# Patient Record
Sex: Female | Born: 1980 | Hispanic: Yes | Marital: Single | Smoking: Never smoker
Health system: Southern US, Community
[De-identification: ages and names within clinical notes are randomized; demographics above are authoritative.]

## PROBLEM LIST (undated history)

## (undated) ENCOUNTER — Inpatient Hospital Stay: Payer: Self-pay

## (undated) DIAGNOSIS — K219 Gastro-esophageal reflux disease without esophagitis: Secondary | ICD-10-CM

---

## 2007-07-02 ENCOUNTER — Ambulatory Visit: Payer: Self-pay | Admitting: Family Medicine

## 2007-07-04 ENCOUNTER — Observation Stay: Payer: Self-pay

## 2007-07-07 ENCOUNTER — Encounter: Payer: Self-pay | Admitting: Maternal & Fetal Medicine

## 2007-07-10 ENCOUNTER — Observation Stay: Payer: Self-pay | Admitting: Obstetrics and Gynecology

## 2007-07-13 ENCOUNTER — Inpatient Hospital Stay: Payer: Self-pay | Admitting: Certified Nurse Midwife

## 2010-12-17 ENCOUNTER — Emergency Department: Payer: Self-pay | Admitting: Emergency Medicine

## 2011-02-20 ENCOUNTER — Emergency Department: Payer: Self-pay | Admitting: Emergency Medicine

## 2011-05-10 ENCOUNTER — Emergency Department: Payer: Self-pay | Admitting: Emergency Medicine

## 2013-04-11 ENCOUNTER — Emergency Department: Payer: Self-pay | Admitting: Emergency Medicine

## 2017-03-03 ENCOUNTER — Emergency Department
Admission: EM | Admit: 2017-03-03 | Discharge: 2017-03-03 | Disposition: A | Discharge status: Home / Self Care | Payer: Self-pay | Attending: Emergency Medicine | Admitting: Emergency Medicine

## 2017-03-03 ENCOUNTER — Emergency Department: Payer: Self-pay

## 2017-03-03 ENCOUNTER — Encounter: Payer: Self-pay | Admitting: Emergency Medicine

## 2017-03-03 ENCOUNTER — Other Ambulatory Visit: Payer: Self-pay

## 2017-03-03 DIAGNOSIS — J069 Acute upper respiratory infection, unspecified: Secondary | ICD-10-CM | POA: Insufficient documentation

## 2017-03-03 DIAGNOSIS — B9789 Other viral agents as the cause of diseases classified elsewhere: Secondary | ICD-10-CM | POA: Insufficient documentation

## 2017-03-03 MED ORDER — FLUTICASONE PROPIONATE 50 MCG/ACT NA SUSP: 1.0000 | Freq: Two times a day (BID) | NASAL | 0 refills | Status: DC

## 2017-03-03 MED ORDER — PSEUDOEPH-BROMPHEN-DM 30-2-10 MG/5ML PO SYRP
10.0000 mL | ORAL_SOLUTION | Freq: Four times a day (QID) | ORAL | 0 refills | Status: DC | PRN
Start: 2017-03-03 — End: 2017-12-05

## 2017-03-03 NOTE — ED Provider Notes (Signed)
[facility] Provider Note  ____________________________________________  Time seen: Approximately 8:00 PM  I have reviewed the triage vital signs and the nursing notes.   HISTORY  Chief Complaint Cough    HPI Meagan Morris is a 36 y.o. female who presents to the emergency department complaining of 4 days worth of symptoms began insidiously.  Patient has been taking Alka-Seltzer and cold and flu medication over-the-counter with no relief.  Patient denies any headache, visual changes, neck pain or stiffness, chest pain, shortness of breath, abdominal pain, nausea vomiting.  Cough is nonproductive.  No other complaints at this time.  No other medications.   History reviewed. No pertinent past medical history.  There are no active problems to display for this patient.   History reviewed. No pertinent surgical history.  Prior to Admission medications   Medication Sig Start Date End Date Taking? Authorizing Provider  brompheniramine-pseudoephedrine-DM 30-2-10 MG/5ML syrup Take 10 mLs by mouth 4 (four) times daily as needed. 03/03/17   Amarion Portell, Delorise RoyalsJonathan D, PA-C  fluticasone (FLONASE) 50 MCG/ACT nasal spray Place 1 spray into both nostrils 2 (two) times daily. 03/03/17   Mahogany Torrance, Delorise RoyalsJonathan D, PA-C    Allergies Patient has no known allergies.  No family history on file.  Social History Social History   Tobacco Use  . Smoking status: Not on file  Substance Use Topics  . Alcohol use: Not on file  . Drug use: Not on file     Review of Systems  Constitutional: Positive fever/chills Eyes: No visual changes. No discharge ENT: Positive for nasal congestion sore throat Cardiovascular: no chest pain. Respiratory: Positive cough. No SOB. Gastrointestinal: No abdominal pain.  No nausea, no vomiting.  No diarrhea.  No constipation. Musculoskeletal: Negative for musculoskeletal pain. Skin: Negative for rash, abrasions,  lacerations, ecchymosis. Neurological: Negative for headaches, focal weakness or numbness. 10-point ROS otherwise negative.  ____________________________________________   PHYSICAL EXAM:  VITAL SIGNS: ED Triage Vitals  Enc Vitals Group     BP 03/03/17 1949 101/66     Pulse Rate 03/03/17 1949 96     Resp 03/03/17 1949 20     Temp 03/03/17 1949 98.3 F (36.8 C)     Temp Source 03/03/17 1949 Oral     SpO2 03/03/17 1949 96 %     Weight 03/03/17 1946 176 lb (79.8 kg)     Height 03/03/17 1946 5\' 2"  (1.575 m)     Head Circumference --      Peak Flow --      Pain Score 03/03/17 1949 9     Pain Loc --      Pain Edu? --      Excl. in GC? --      Constitutional: Alert and oriented. Well appearing and in no acute distress. Eyes: Conjunctivae are normal. PERRL. EOMI. Head: Atraumatic. ENT:      Ears: EACs and TMs unremarkable bilaterally      Nose: Moderate clear congestion/rhinnorhea.      Mouth/Throat: Mucous membranes are moist.  Pharynx mildly erythematous but nonedematous.  Tonsils unremarkable bilaterally.  Uvula is midline.   Neck: No stridor.  Neck is supple full range of motion Hematological/Lymphatic/Immunilogical: No cervical lymphadenopathy. Cardiovascular: Normal rate, regular rhythm. Normal S1 and S2.  Good peripheral circulation. Respiratory: Normal respiratory effort without tachypnea or retractions. Lungs CTAB. Good air entry to the bases with no decreased or absent breath sounds. Musculoskeletal: Full range of motion to all extremities. No gross deformities appreciated.  Neurologic:  Normal speech and language. No gross focal neurologic deficits are appreciated.  Skin:  Skin is warm, dry and intact. No rash noted. Psychiatric: Mood and affect are normal. Speech and behavior are normal. Patient exhibits appropriate insight and judgement.   ____________________________________________   LABS (all labs ordered are listed, but only abnormal results are  displayed)  Labs Reviewed - No data to display ____________________________________________  EKG   ____________________________________________  RADIOLOGY Festus BarrenI, Eytan Carrigan D Ivonna Kinnick, personally viewed and evaluated these images (plain radiographs) as part of my medical decision making, as well as reviewing the written report by the radiologist.  Dg Chest 2 View  Result Date: 03/03/2017 CLINICAL DATA:  36 year old female with cough. EXAM: CHEST  2 VIEW COMPARISON:  None. FINDINGS: The heart size and mediastinal contours are within normal limits. Both lungs are clear. The visualized skeletal structures are unremarkable. IMPRESSION: No active cardiopulmonary disease. Electronically Signed   By: Elgie CollardArash  Radparvar M.D.   On: 03/03/2017 20:17    ____________________________________________    PROCEDURES  Procedure(s) performed:    Procedures    Medications - No data to display   ____________________________________________   INITIAL IMPRESSION / ASSESSMENT AND PLAN / ED COURSE  Pertinent labs & imaging results that were available during my care of the patient were reviewed by me and considered in my medical decision making (see chart for details).  Review of the [place] CSRS was performed in accordance of the [facility] prior to dispensing any controlled drugs.     Patient's diagnosis is consistent with viral URI.  Differential included influenza, viral URI, strep.  Chest x-ray reveals no acute consolidation consistent with pneumonia.  Exam was otherwise reassuring.  No indication for further workup.. Patient will be discharged home with prescriptions for Flonase, Bromfed cough syrup. Patient is to follow up with primary care as needed or otherwise directed. Patient is given ED precautions to return to the ED for any worsening or new symptoms.     ____________________________________________  FINAL CLINICAL IMPRESSION(S) / ED DIAGNOSES  Final diagnoses:  Viral URI with cough       NEW MEDICATIONS STARTED DURING THIS VISIT:  ED Discharge Orders        Ordered    fluticasone (FLONASE) 50 MCG/ACT nasal spray  2 times daily     03/03/17 2033    brompheniramine-pseudoephedrine-DM 30-2-10 MG/5ML syrup  4 times daily PRN     03/03/17 2033          This chart was dictated using voice recognition software/Dragon. Despite best efforts to proofread, errors can occur which can change the meaning. Any change was purely unintentional.    Racheal PatchesCuthriell, Lillyonna Armstead D, PA-C 03/03/17 2034    Sharyn CreamerQuale, Mark, MD 03/04/17 [phone]

## 2017-03-03 NOTE — ED Triage Notes (Signed)
Pt presents w/ cough and fever x 4 days. Pt c/o slight nausea, denies vomiting.

## 2017-03-03 NOTE — ED Notes (Signed)
Reviewed discharge instructions, follow-up care, and prescriptions with patient. Patient verbalized understanding of all information reviewed. Patient stable, with no distress noted at this time.    

## 2017-03-03 NOTE — ED Notes (Signed)
Patient transported to X-ray 

## 2017-03-12 NOTE — L&D Delivery Note (Signed)
Delivery Note  First Stage: Labor onset: 0300 Augmentation : AROM Analgesia /Anesthesia intrapartum: Epidural AROM at 1012  Second Stage: Complete dilation at 1303 Onset of pushing at 1308 FHR second stage Cat I   Delivery of a viable female on 01/11/2018 at 1310 by CNM delivery of fetal head in ROA position with restitution to ROT. No nuchal cord;  Anterior then posterior shoulders delivered easily with gentle downward traction. Baby placed on mom's chest, and attended to by peds.  Cord double clamped after cessation of pulsation, cut by FOB Cord blood sample collected    Third Stage: Placenta delivered spontaneously intact with 3VC @ 1315 Placenta disposition: routine disposal Uterine tone initially boggy and massaged firm with brisk bleeding, Cytotec PR, uterus then remained firm with scant bleeding.   1st deg perineal laceration identified  Anesthesia for repair: epidural Repair: 3-0 Vicryl SH x 1 Est. Blood Loss (mL):  Complications: none  Mom to postpartum.  Baby to Couplet care / Skin to Skin.  Newborn: Birth Weight: pending Apgar Scores: 8/9 Feeding planned: Br/Bo

## 2017-03-14 ENCOUNTER — Other Ambulatory Visit: Payer: Self-pay

## 2017-03-14 ENCOUNTER — Emergency Department
Admission: EM | Admit: 2017-03-14 | Discharge: 2017-03-14 | Disposition: A | Discharge status: Home / Self Care | Payer: Self-pay | Attending: Emergency Medicine | Admitting: Emergency Medicine

## 2017-03-14 ENCOUNTER — Encounter: Payer: Self-pay | Admitting: Emergency Medicine

## 2017-03-14 DIAGNOSIS — J101 Influenza due to other identified influenza virus with other respiratory manifestations: Secondary | ICD-10-CM

## 2017-03-14 DIAGNOSIS — J09X2 Influenza due to identified novel influenza A virus with other respiratory manifestations: Secondary | ICD-10-CM | POA: Insufficient documentation

## 2017-03-14 LAB — GROUP A STREP BY PCR: Group A Strep by PCR: NOT DETECTED

## 2017-03-14 LAB — INFLUENZA PANEL BY PCR (TYPE A & B)
INFLAPCR: POSITIVE — AB
Influenza B By PCR: NEGATIVE

## 2017-03-14 MED ORDER — IBUPROFEN 800 MG PO TABS
800.0000 mg | ORAL_TABLET | Freq: Once | ORAL | Status: AC
  Administered 2017-03-14: 800 mg via ORAL
  Filled 2017-03-14: qty 1

## 2017-03-14 MED ORDER — OSELTAMIVIR PHOSPHATE 75 MG PO CAPS: 75.0000 mg | ORAL_CAPSULE | Freq: Two times a day (BID) | ORAL | 0 refills | Status: AC

## 2017-03-14 NOTE — ED Provider Notes (Signed)
[facility] Emergency Department Provider Note  ____________________________________________  Time seen: Approximately 6:00 PM  I have reviewed the triage vital signs and the nursing notes.   HISTORY  Chief Complaint Generalized Body Aches    HPI Meagan Morris is a 37 y.o. female presents to the emergency department with fever, pharyngitis, headache, chills and abdominal discomfort that started 1 day ago.  Patient reports that she was seen at [facility] on 03/03/2017 and her symptoms seemingly resolved until last night.  She denies diarrhea or vomiting.  She is tolerating fluids by mouth.  She denies chest pain, chest tightness, nausea and abdominal pain.  No alleviating measures have been attempted.   History reviewed. No pertinent past medical history.  There are no active problems to display for this patient.   History reviewed. No pertinent surgical history.  Prior to Admission medications   Medication Sig Start Date End Date Taking? Authorizing Provider  brompheniramine-pseudoephedrine-DM 30-2-10 MG/5ML syrup Take 10 mLs by mouth 4 (four) times daily as needed. 03/03/17   Cuthriell, Delorise RoyalsJonathan D, PA-C  fluticasone (FLONASE) 50 MCG/ACT nasal spray Place 1 spray into both nostrils 2 (two) times daily. 03/03/17   Cuthriell, Delorise RoyalsJonathan D, PA-C  oseltamivir (TAMIFLU) 75 MG capsule Take 1 capsule (75 mg total) by mouth 2 (two) times daily for 5 days. 03/14/17 03/19/17  Orvil FeilWoods, Remer Couse M, PA-C    Allergies Patient has no known allergies.  No family history on file.  Social History Social History   Tobacco Use  . Smoking status: Not on file  Substance Use Topics  . Alcohol use: Not on file  . Drug use: Not on file     Review of Systems  Constitutional: Patient has fever.  Eyes: No visual changes. No discharge ENT: Patient has congestion.  Cardiovascular: no chest pain. Respiratory: Patient has cough.  Gastrointestinal: No abdominal  pain.  No nausea, no vomiting. Patient had diarrhea.  Genitourinary: Negative for dysuria. No hematuria Musculoskeletal: Patient has myalgias.  Skin: Negative for rash, abrasions, lacerations, ecchymosis. Neurological: Patient has headache, no focal weakness or numbness.    ____________________________________________   PHYSICAL EXAM:  VITAL SIGNS: ED Triage Vitals  Enc Vitals Group     BP 03/14/17 1612 129/78     Pulse Rate 03/14/17 1612 (!) 121     Resp 03/14/17 1612 (!) 22     Temp 03/14/17 1612 (!) 103 F (39.4 C)     Temp Source 03/14/17 1612 Oral     SpO2 03/14/17 1612 98 %     Weight 03/14/17 1614 175 lb (79.4 kg)     Height 03/14/17 1614 5\' 5"  (1.651 m)     Head Circumference --      Peak Flow --      Pain Score 03/14/17 1750 4     Pain Loc --      Pain Edu? --      Excl. in GC? --     Constitutional: Alert and oriented. Patient is lying supine. Eyes: Conjunctivae are normal. PERRL. EOMI. Head: Atraumatic. ENT:      Ears: Tympanic membranes are mildly injected with mild effusion bilaterally.       Nose: No congestion/rhinnorhea.      Mouth/Throat: Mucous membranes are moist. Posterior pharynx is mildly erythematous.  Hematological/Lymphatic/Immunilogical: No cervical lymphadenopathy.  Cardiovascular: Normal rate, regular rhythm. Normal S1 and S2.  Good peripheral circulation. Respiratory: Normal respiratory effort without tachypnea or retractions. Lungs CTAB. Good air entry to the  bases with no decreased or absent breath sounds. Gastrointestinal: Bowel sounds 4 quadrants. Soft and nontender to palpation. No guarding or rigidity. No palpable masses. No distention. No CVA tenderness. Musculoskeletal: Full range of motion to all extremities. No gross deformities appreciated. Neurologic:  Normal speech and language. No gross focal neurologic deficits are appreciated.  Skin:  Skin is warm, dry and intact. No rash noted. Psychiatric: Mood and affect are normal.  Speech and behavior are normal. Patient exhibits appropriate insight and judgement.  ____________________________________________   LABS (all labs ordered are listed, but only abnormal results are displayed)  Labs Reviewed  INFLUENZA PANEL BY PCR (TYPE A & B) - Abnormal; Notable for the following components:      Result Value   Influenza A By PCR POSITIVE (*)    All other components within normal limits  GROUP A STREP BY PCR   ____________________________________________  EKG   ____________________________________________  RADIOLOGY   No results found.  ____________________________________________    PROCEDURES  Procedure(s) performed:    Procedures    Medications  ibuprofen (ADVIL,MOTRIN) tablet 800 mg (800 mg Oral Given 03/14/17 1625)     ____________________________________________   INITIAL IMPRESSION / ASSESSMENT AND PLAN / ED COURSE  Pertinent labs & imaging results that were available during my care of the patient were reviewed by me and considered in my medical decision making (see chart for details).  Review of the [place] CSRS was performed in accordance of the [facility] prior to dispensing any controlled drugs.     Assessment and plan Influenza Patient presents to the emergency department with rhinorrhea, congestion, nonproductive cough, pharyngitis and abdominal discomfort.  Patient tested positive for influenza in the emergency department.  Differential diagnosis originally included influenza versus unspecified viral URI.  Patient was given ibuprofen in the emergency department.  She was discharged with Tamiflu.  Patient was advised to follow-up with primary care in 1 week.  All patient questions were answered.   ____________________________________________  FINAL CLINICAL IMPRESSION(S) / ED DIAGNOSES  Final diagnoses:  Influenza A      NEW MEDICATIONS STARTED DURING THIS VISIT:  ED Discharge Orders        Ordered    oseltamivir (TAMIFLU) 75  MG capsule  2 times daily     03/14/17 1743          This chart was dictated using voice recognition software/Dragon. Despite best efforts to proofread, errors can occur which can change the meaning. Any change was purely unintentional.    Orvil Feil, PA-C 03/14/17 1803    Sharman Cheek, MD 03/14/17 [phone]

## 2017-03-14 NOTE — ED Triage Notes (Signed)
Presents with fever and sore throat and cough for the past couple of days   States she was seen about 1 week ago but feels worse

## 2017-06-03 LAB — HM PAP SMEAR: HM Pap smear: POSITIVE

## 2017-06-04 ENCOUNTER — Other Ambulatory Visit (HOSPITAL_COMMUNITY): Payer: Self-pay | Admitting: Family Medicine

## 2017-06-04 DIAGNOSIS — Z369 Encounter for antenatal screening, unspecified: Secondary | ICD-10-CM

## 2017-06-10 LAB — OB RESULTS CONSOLE HGB/HCT, BLOOD: HEMOGLOBIN: 12.5

## 2017-06-10 LAB — OB RESULTS CONSOLE HIV ANTIBODY (ROUTINE TESTING): HIV: NONREACTIVE

## 2017-06-10 LAB — OB RESULTS CONSOLE HEPATITIS B SURFACE ANTIGEN: Hepatitis B Surface Ag: NEGATIVE

## 2017-06-11 LAB — OB RESULTS CONSOLE RPR: RPR: NONREACTIVE

## 2017-06-17 ENCOUNTER — Ambulatory Visit (HOSPITAL_BASED_OUTPATIENT_CLINIC_OR_DEPARTMENT_OTHER)
Admission: RE | Admit: 2017-06-17 | Discharge: 2017-06-17 | Disposition: A | Discharge status: Home / Self Care | Payer: MEDICAID | Source: Ambulatory Visit | Attending: Maternal & Fetal Medicine | Admitting: Maternal & Fetal Medicine

## 2017-06-17 ENCOUNTER — Ambulatory Visit
Admission: RE | Admit: 2017-06-17 | Discharge: 2017-06-17 | Disposition: A | Discharge status: Home / Self Care | Payer: MEDICAID | Source: Ambulatory Visit | Attending: Maternal & Fetal Medicine | Admitting: Maternal & Fetal Medicine

## 2017-06-17 VITALS — BP 102/71 | HR 75 | Temp 98.6°F | Resp 17 | Ht 65.0 in | Wt 172.6 lb

## 2017-06-17 DIAGNOSIS — Z369 Encounter for antenatal screening, unspecified: Secondary | ICD-10-CM

## 2017-06-17 DIAGNOSIS — O09521 Supervision of elderly multigravida, first trimester: Secondary | ICD-10-CM

## 2017-06-17 NOTE — Progress Notes (Signed)
Referring Provider:  Oswaldo Conroy Length of Consultation: 6 mintes  [name] was referred to [facility] for genetic counseling because of advanced maternal age.  The patient will be 37 years old at the time of delivery.  This note summarizes the information we discussed.    We explained that the chance of a chromosome abnormality increases with maternal age.  Chromosomes and examples of chromosome problems were reviewed.  Humans typically have 46 chromosomes in each cell, with half passed through each sperm and egg.  Any change in the number or structure of chromosomes can increase the risk of problems in the physical and mental development of a pregnancy.   Based upon age of the patient, the chance of any chromosome abnormality was 1 in 64. The chance of Down syndrome, the most common chromosome problem associated with maternal age, was 1 in 8.  The risk of chromosome problems is in addition to the 3% general population risk for birth defects and mental retardation.  The greatest chance, of course, is that the baby would be born in good health.  We discussed the following prenatal screening and testing options for this pregnancy:  First trimester screening, which includes nuchal translucency ultrasound screen and first trimester maternal serum marker screening.  The nuchal translucency has approximately an 80% detection rate for Down syndrome and can be positive for other chromosome abnormalities as well as heart defects.  When combined with a maternal serum marker screening, the detection rate is up to 90% for Down syndrome and up to 97% for trisomy 18.     The chorionic villus sampling procedure is available for first trimester chromosome analysis.  This involves the withdrawal of a small amount of chorionic villi (tissue from the developing placenta).  Risk of pregnancy loss is estimated to be approximately 1 in 200 to 1 in 100 (0.5 to 1%).  There  is approximately a 1% (1 in 100) chance that the CVS chromosome results will be unclear.  Chorionic villi cannot be tested for neural tube defects.     Maternal serum marker screening, a blood test that measures pregnancy proteins, can provide risk assessments for Down syndrome, trisomy 18, and open neural tube defects (spina bifida, anencephaly). Because it does not directly examine the fetus, it cannot positively diagnose or rule out these problems.  Targeted ultrasound uses high frequency sound waves to create an image of the developing fetus.  An ultrasound is often recommended as a routine means of evaluating the pregnancy.  It is also used to screen for fetal anatomy problems (for example, a heart defect) that might be suggestive of a chromosomal or other abnormality.   Amniocentesis involves the removal of a small amount of amniotic fluid from the sac surrounding the fetus with the use of a thin needle inserted through the maternal abdomen and uterus.  Ultrasound guidance is used throughout the procedure.  Fetal cells from amniotic fluid are directly evaluated and > 99.5% of chromosome problems and > 98% of open neural tube defects can be detected. This procedure is generally performed after the 15th week of pregnancy.  The main risks to this procedure include complications leading to miscarriage in less than 1 in 200 cases (0.5%).  We also reviewed the availability of cell free fetal DNA testing from maternal blood to determine whether or not the baby may have either Down syndrome, trisomy 17, or trisomy 66.  This test utilizes a maternal blood sample and  DNA sequencing technology to isolate circulating cell free fetal DNA from maternal plasma.  The fetal DNA can then be analyzed for DNA sequences that are derived from the three most common chromosomes involved in aneuploidy, chromosomes 13, 18, and 21.  If the overall amount of DNA is greater than the expected level for any of these chromosomes,  aneuploidy is suspected.  While we do not consider it a replacement for invasive testing and karyotype analysis, a negative result from this testing would be reassuring, though not a guarantee of a normal chromosome complement for the baby.  An abnormal result is certainly suggestive of an abnormal chromosome complement, though we would still recommend CVS or amniocentesis to confirm any findings from this testing.  Cystic Fibrosis and Spinal Muscular Atrophy (SMA) screening were also discussed with the patient. Both conditions are recessive, which means that both parents must be carriers in order to have a child with the disease.  Cystic fibrosis (CF) is one of the most common genetic conditions in persons of Caucasian ancestry.  This condition occurs in approximately 1 in 2,500 Caucasian persons and results in thickened secretions in the lungs, digestive, and reproductive systems.  For a baby to be at risk for having CF, both of the parents must be carriers for this condition.  Approximately 1 in 64 Caucasian persons is a carrier for CF.  Current carrier testing looks for the most common mutations in the gene for CF and can detect approximately 90% of carriers in the Caucasian population.  This means that the carrier screening can greatly reduce, but cannot eliminate, the chance for an individual to have a child with CF.  If an individual is found to be a carrier for CF, then carrier testing would be available for the partner. As part of [place] 's newborn screening profile, all babies born in the state of [place] will have a two-tier screening process.  Specimens are first tested to determine the concentration of immunoreactive trypsinogen (IRT).  The top 5% of specimens with the highest IRT values then undergo DNA testing using a panel of over 40 common CF mutations. SMA is a neurodegenerative disorder that leads to atrophy of skeletal muscle and overall weakness.  This condition is also more  prevalent in the Caucasian population, with 1 in 40-1 in 60 persons being a carrier and 1 in 6,000-1 in 10,000 children being affected.  There are multiple forms of the disease, with some causing death in infancy to other forms with survival into adulthood.  The genetics of SMA is complex, but carrier screening can detect up to 95% of carriers in the Caucasian population.  Similar to CF, a negative result can greatly reduce, but cannot eliminate, the chance to have a child with SMA.  We obtained a detailed family history and pregnancy history.  The family history is unremarkable for birth defects, developmental delays, recurrent pregnancy loss or known chromosome abnormalities.  [name] stated that this is the first pregnancy for she and her current partner.  She had five pregnancies with her prior partner which resulted in two early miscarriages and three healthy children, ages 50, 66 and 9 years.  She reported no complications or exposures in this pregnancy that would be expected to increase the risk for birth defects.  After consideration of the options, [name] elected to proceed with an ultrasound only and to decline aneuploidy screening as well as carrier screening for CF and SMA. Prior hemoglobinopathy screening at  [facility] was normal.  An ultrasound was performed at the time of the visit.  The gestational age was consistent with  10 weeks.  Fetal anatomy could not be assessed due to early gestational age.  Please refer to the ultrasound report for details of that study.  She was scheduled to return to our clinic at [redacted] weeks gestation for an anatomy ultrasound.  [name] was encouraged to call with questions or concerns.  We can be contacted at [phone].   Tests Ordered: none   Cherly Andersoneborah F. Ishmael Berkovich, MS, CGC

## 2017-06-20 NOTE — Progress Notes (Signed)
Pt seen by me, agree with assessment and plan outlined in [facility] Wells's note 

## 2017-08-08 ENCOUNTER — Other Ambulatory Visit: Payer: Self-pay | Admitting: *Deleted

## 2017-08-08 DIAGNOSIS — O09521 Supervision of elderly multigravida, first trimester: Secondary | ICD-10-CM

## 2017-08-12 ENCOUNTER — Ambulatory Visit
Admission: RE | Admit: 2017-08-12 | Discharge: 2017-08-12 | Disposition: A | Discharge status: Home / Self Care | Payer: Medicaid Other | Source: Ambulatory Visit | Attending: Obstetrics and Gynecology | Admitting: Obstetrics and Gynecology

## 2017-08-12 DIAGNOSIS — O09521 Supervision of elderly multigravida, first trimester: Secondary | ICD-10-CM | POA: Insufficient documentation

## 2017-08-12 DIAGNOSIS — Z3A18 18 weeks gestation of pregnancy: Secondary | ICD-10-CM | POA: Insufficient documentation

## 2017-10-14 LAB — OB RESULTS CONSOLE HIV ANTIBODY (ROUTINE TESTING): HIV: NONREACTIVE

## 2017-10-14 LAB — HIV ANTIBODY (ROUTINE TESTING W REFLEX): HIV 1&2 Ab, 4th Generation: NONREACTIVE

## 2017-10-15 LAB — OB RESULTS CONSOLE RPR: RPR: NONREACTIVE

## 2017-12-04 ENCOUNTER — Other Ambulatory Visit: Payer: Self-pay

## 2017-12-04 ENCOUNTER — Encounter: Payer: Self-pay | Admitting: *Deleted

## 2017-12-04 ENCOUNTER — Observation Stay
Admission: EM | Admit: 2017-12-04 | Discharge: 2017-12-05 | Disposition: A | Discharge status: Home / Self Care | Payer: Medicaid Other | Attending: Obstetrics and Gynecology | Admitting: Obstetrics and Gynecology

## 2017-12-04 DIAGNOSIS — O09523 Supervision of elderly multigravida, third trimester: Secondary | ICD-10-CM | POA: Insufficient documentation

## 2017-12-04 DIAGNOSIS — Z3A34 34 weeks gestation of pregnancy: Secondary | ICD-10-CM | POA: Diagnosis not present

## 2017-12-04 DIAGNOSIS — O4703 False labor before 37 completed weeks of gestation, third trimester: Secondary | ICD-10-CM | POA: Diagnosis present

## 2017-12-04 DIAGNOSIS — O09213 Supervision of pregnancy with history of pre-term labor, third trimester: Secondary | ICD-10-CM

## 2017-12-04 DIAGNOSIS — O4700 False labor before 37 completed weeks of gestation, unspecified trimester: Secondary | ICD-10-CM | POA: Diagnosis present

## 2017-12-04 LAB — CBC WITH DIFFERENTIAL/PLATELET
Basophils Absolute: 0 10*3/uL (ref 0–0.1)
Basophils Relative: 1 %
EOS ABS: 0.1 10*3/uL (ref 0–0.7)
Eosinophils Relative: 1 %
HCT: 31.3 % — ABNORMAL LOW (ref 35.0–47.0)
Hemoglobin: 10.7 g/dL — ABNORMAL LOW (ref 12.0–16.0)
Lymphocytes Relative: 27 %
Lymphs Abs: 2.1 10*3/uL (ref 1.0–3.6)
MCH: 28.5 pg (ref 26.0–34.0)
MCHC: 34 g/dL (ref 32.0–36.0)
MCV: 83.8 fL (ref 80.0–100.0)
MONO ABS: 0.4 10*3/uL (ref 0.2–0.9)
Monocytes Relative: 5 %
Neutro Abs: 5.1 10*3/uL (ref 1.4–6.5)
Neutrophils Relative %: 66 %
Platelets: 211 10*3/uL (ref 150–440)
RBC: 3.73 MIL/uL — ABNORMAL LOW (ref 3.80–5.20)
RDW: 13.6 % (ref 11.5–14.5)
WBC: 7.8 10*3/uL (ref 3.6–11.0)

## 2017-12-04 LAB — URINALYSIS, COMPLETE (UACMP) WITH MICROSCOPIC
BILIRUBIN URINE: NEGATIVE
GLUCOSE, UA: NEGATIVE mg/dL
Ketones, ur: 20 mg/dL — AB
NITRITE: NEGATIVE
PH: 7 (ref 5.0–8.0)
Protein, ur: NEGATIVE mg/dL
Specific Gravity, Urine: 1.008 (ref 1.005–1.030)

## 2017-12-04 LAB — TYPE AND SCREEN
ABO/RH(D): O POS
Antibody Screen: NEGATIVE

## 2017-12-04 LAB — FETAL FIBRONECTIN: Fetal Fibronectin: NEGATIVE

## 2017-12-04 MED ORDER — OXYTOCIN 40 UNITS IN LACTATED RINGERS INFUSION - SIMPLE MED: 2.5000 [IU]/h | INTRAVENOUS | Status: DC

## 2017-12-04 MED ORDER — LACTATED RINGERS IV SOLN: 500.0000 mL | INTRAVENOUS | Status: DC | PRN

## 2017-12-04 MED ORDER — ACETAMINOPHEN 325 MG PO TABS
650.0000 mg | ORAL_TABLET | ORAL | Status: DC | PRN
  Administered 2017-12-04: 650 mg via ORAL
  Filled 2017-12-04: qty 2

## 2017-12-04 MED ORDER — OXYTOCIN BOLUS FROM INFUSION: 500.0000 mL | Freq: Once | INTRAVENOUS | Status: DC

## 2017-12-04 MED ORDER — LACTATED RINGERS IV BOLUS
500.0000 mL | Freq: Once | INTRAVENOUS | Status: AC
  Administered 2017-12-04: 500 mL via INTRAVENOUS

## 2017-12-04 MED ORDER — NIFEDIPINE 10 MG PO CAPS
20.0000 mg | ORAL_CAPSULE | Freq: Once | ORAL | Status: AC
  Administered 2017-12-04: 20 mg via ORAL
  Filled 2017-12-04: qty 2

## 2017-12-04 MED ORDER — LACTATED RINGERS IV SOLN
INTRAVENOUS | Status: DC
  Administered 2017-12-04: 21:00:00 via INTRAVENOUS

## 2017-12-04 MED ORDER — BETAMETHASONE SOD PHOS & ACET 6 (3-3) MG/ML IJ SUSP
12.0000 mg | INTRAMUSCULAR | Status: DC
  Administered 2017-12-04: 12 mg via INTRAMUSCULAR
  Filled 2017-12-04: qty 2

## 2017-12-04 NOTE — Progress Notes (Signed)
Meagan Morris is a 37 y.o. G6P0003 at [redacted]w[redacted]d admitted for OBS due to UC's since this am that are hurting.   Subjective: "Theya re still hurting"  Objective: BP 112/68 (BP Location: Left Arm)   Pulse 68   Resp 18   Ht 5\' 5"  (1.651 m)   Wt 83 kg   LMP 03/18/2017   BMI 30.45 kg/m  No intake/output data recorded. No intake/output data recorded.  FHT: 120, reactive NST with 15 x 15 BPM UC:  Irregular after fluid bolus and Nifedipine 20 mg  Int BJ:YNWGNF/AOZ os:1 cm multip cx SVE:   Dilation: Closed Exam by:: C Winefred Hillesheim CNM  Labs: Lab Results  Component Value Date   WBC 7.8 12/04/2017   HGB 10.7 (L) 12/04/2017   HCT 31.3 (L) 12/04/2017   MCV 83.8 12/04/2017   PLT 211 12/04/2017    Assessment / Plan: A:1. IUP at 34 5/7 weeks 2. Th PTL P: FFN pending 2. Continue to hydrate PO fluids 3. Will add Nifedipine at 4 hours post first dose 4. Continue to monitor _______________________________ Sharee Pimple 12/04/2017, 9:38 PM

## 2017-12-04 NOTE — OB Triage Note (Signed)
Vaginal pain and pressure that's worse activity that has been ongoing for the last week. Additionally sharp pain on lower left side of abdomen that is also worse with movement.  Denies contractions, LOF, and bleeding. Positive Fetal movement. EFM placed, will continue to monitor.

## 2017-12-05 ENCOUNTER — Observation Stay
Admission: RE | Admit: 2017-12-05 | Discharge: 2017-12-05 | Disposition: A | Discharge status: Home / Self Care | Payer: Medicaid Other | Source: Home / Self Care | Attending: Obstetrics & Gynecology | Admitting: Obstetrics & Gynecology

## 2017-12-05 DIAGNOSIS — O4703 False labor before 37 completed weeks of gestation, third trimester: Secondary | ICD-10-CM | POA: Diagnosis not present

## 2017-12-05 DIAGNOSIS — Z3490 Encounter for supervision of normal pregnancy, unspecified, unspecified trimester: Secondary | ICD-10-CM

## 2017-12-05 MED ORDER — NIFEDIPINE 10 MG PO CAPS
ORAL_CAPSULE | ORAL | Status: AC
  Filled 2017-12-05: qty 2

## 2017-12-05 MED ORDER — BETAMETHASONE SOD PHOS & ACET 6 (3-3) MG/ML IJ SUSP
INTRAMUSCULAR | Status: AC
  Administered 2017-12-05: 12 mg
  Filled 2017-12-05: qty 5

## 2017-12-05 MED ORDER — NIFEDIPINE 10 MG PO CAPS
20.0000 mg | ORAL_CAPSULE | Freq: Once | ORAL | Status: AC
  Administered 2017-12-05: 20 mg via ORAL

## 2017-12-05 NOTE — Discharge Instructions (Signed)
Braxton Hicks Contractions °Contractions of the uterus can occur throughout pregnancy, but they are not always a sign that you are in labor. You may have practice contractions called Braxton Hicks contractions. These false labor contractions are sometimes confused with true labor. °What are Braxton Hicks contractions? °Braxton Hicks contractions are tightening movements that occur in the muscles of the uterus before labor. Unlike true labor contractions, these contractions do not result in opening (dilation) and thinning of the cervix. Toward the end of pregnancy (32-34 weeks), Braxton Hicks contractions can happen more often and may become stronger. These contractions are sometimes difficult to tell apart from true labor because they can be very uncomfortable. You should not feel embarrassed if you go to the hospital with false labor. °Sometimes, the only way to tell if you are in true labor is for your health care provider to look for changes in the cervix. The health care provider will do a physical exam and may monitor your contractions. If you are not in true labor, the exam should show that your cervix is not dilating and your water has not broken. °If there are other health problems associated with your pregnancy, it is completely safe for you to be sent home with false labor. You may continue to have Braxton Hicks contractions until you go into true labor. °How to tell the difference between true labor and false labor °True labor °· Contractions last 30-70 seconds. °· Contractions become very regular. °· Discomfort is usually felt in the top of the uterus, and it spreads to the lower abdomen and low back. °· Contractions do not go away with walking. °· Contractions usually become more intense and increase in frequency. °· The cervix dilates and gets thinner. °False labor °· Contractions are usually shorter and not as strong as true labor contractions. °· Contractions are usually irregular. °· Contractions  are often felt in the front of the lower abdomen and in the groin. °· Contractions may go away when you walk around or change positions while lying down. °· Contractions get weaker and are shorter-lasting as time goes on. °· The cervix usually does not dilate or become thin. °Follow these instructions at home: °· Take over-the-counter and prescription medicines only as told by your health care provider. °· Keep up with your usual exercises and follow other instructions from your health care provider. °· Eat and drink lightly if you think you are going into labor. °· If Braxton Hicks contractions are making you uncomfortable: °? Change your position from lying down or resting to walking, or change from walking to resting. °? Sit and rest in a tub of warm water. °? Drink enough fluid to keep your urine pale yellow. Dehydration may cause these contractions. °? Do slow and deep breathing several times an hour. °· Keep all follow-up prenatal visits as told by your health care provider. This is important. °Contact a health care provider if: °· You have a fever. °· You have continuous pain in your abdomen. °Get help right away if: °· Your contractions become stronger, more regular, and closer together. °· You have fluid leaking or gushing from your vagina. °· You pass blood-tinged mucus (bloody show). °· You have bleeding from your vagina. °· You have low back pain that you never had before. °· You feel your baby’s head pushing down and causing pelvic pressure. °· Your baby is not moving inside you as much as it used to. °Summary °· Contractions that occur before labor are called Braxton   Hicks contractions, false labor, or practice contractions. °· Braxton Hicks contractions are usually shorter, weaker, farther apart, and less regular than true labor contractions. True labor contractions usually become progressively stronger and regular and they become more frequent. °· Manage discomfort from Braxton Hicks contractions by  changing position, resting in a warm bath, drinking plenty of water, or practicing deep breathing. °This information is not intended to replace advice given to you by your health care provider. Make sure you discuss any questions you have with your health care provider. °Document Released: 07/12/2016 Document Revised: 07/12/2016 Document Reviewed: 07/12/2016 °Elsevier Interactive Patient Education © 2018 [employer] ° °

## 2017-12-05 NOTE — Discharge Summary (Signed)
Obstetric Discharge Summary Reason for Admission: UC's since am  Prenatal Procedures: NST Intrapartum Procedures: N/A Postpartum Procedures: N/A Complications-Operative and Postpartum: N/A Hemoglobin  Date Value Ref Range Status  12/04/2017 10.7 (L) 12.0 - 16.0 g/dL Final  16/12/9602 54.0  Final   HCT  Date Value Ref Range Status  12/04/2017 31.3 (L) 35.0 - 47.0 % Final    Physical Exam:  General: A,A& O x3 HEART: S1S2, RRR, no M/R/G LUNGS:CTA bilat, no W/R/R Abd; Gravid Cx:1 cm ext os/Int os closed DVT Evaluation:Neg Homans   Discharge Diagnoses: IUP at 34 5/7 weeks with Th PTL FFN:neg Discharge Information: Date: 12/05/2017 Activity: Up ad LIB  Diet: Regular  Medications: PNV Condition: STable  Instructions: FU as scheduled with clinic and fu here for any further S/S of labor. FU here tomorrow for 2nd BMZ.  Discharge to: Home   Meagan Morris 12/05/2017, 12:53 AM

## 2017-12-05 NOTE — Progress Notes (Signed)
Patient came in for second BMZ injection. Patient reported no contraction, no vaginal bleeding, no LOF, and positive FM. BMZ given by RN. Prescription for procardia given to patient. All questions answered. Patient d/c home in stable condition.

## 2017-12-06 LAB — URINE CULTURE: Culture: >=100000 — AB

## 2017-12-18 LAB — OB RESULTS CONSOLE GC/CHLAMYDIA
Chlamydia: NEGATIVE
Gonorrhea: NEGATIVE

## 2017-12-18 LAB — OB RESULTS CONSOLE GBS: GBS: NEGATIVE

## 2018-01-07 ENCOUNTER — Other Ambulatory Visit: Payer: Self-pay

## 2018-01-07 ENCOUNTER — Observation Stay
Admission: EM | Admit: 2018-01-07 | Discharge: 2018-01-07 | Disposition: A | Discharge status: Home / Self Care | Payer: Medicaid Other | Attending: Certified Nurse Midwife | Admitting: Certified Nurse Midwife

## 2018-01-07 DIAGNOSIS — O26893 Other specified pregnancy related conditions, third trimester: Principal | ICD-10-CM | POA: Insufficient documentation

## 2018-01-07 DIAGNOSIS — Z3A39 39 weeks gestation of pregnancy: Secondary | ICD-10-CM | POA: Diagnosis not present

## 2018-01-07 DIAGNOSIS — O26899 Other specified pregnancy related conditions, unspecified trimester: Secondary | ICD-10-CM | POA: Diagnosis present

## 2018-01-07 DIAGNOSIS — R109 Unspecified abdominal pain: Secondary | ICD-10-CM

## 2018-01-07 LAB — URINALYSIS, COMPLETE (UACMP) WITH MICROSCOPIC
BACTERIA UA: NONE SEEN
Bilirubin Urine: NEGATIVE
GLUCOSE, UA: NEGATIVE mg/dL
HGB URINE DIPSTICK: NEGATIVE
KETONES UR: NEGATIVE mg/dL
Leukocytes, UA: NEGATIVE
Nitrite: NEGATIVE
Protein, ur: 30 mg/dL — AB
Specific Gravity, Urine: 1.028 (ref 1.005–1.030)
pH: 5 (ref 5.0–8.0)

## 2018-01-07 MED ORDER — MENTHOL 3 MG MT LOZG
1.0000 | LOZENGE | OROMUCOSAL | Status: DC | PRN
  Filled 2018-01-07 (×2): qty 9

## 2018-01-07 MED ORDER — MENTHOL 3 MG MT LOZG: 1.0000 | LOZENGE | OROMUCOSAL | 12 refills | Status: DC | PRN

## 2018-01-07 MED ORDER — ACETAMINOPHEN 325 MG PO TABS: 650.0000 mg | ORAL_TABLET | ORAL | Status: AC | PRN

## 2018-01-07 MED ORDER — ACETAMINOPHEN 325 MG PO TABS: 650.0000 mg | ORAL_TABLET | ORAL | Status: DC | PRN

## 2018-01-07 NOTE — OB Triage Note (Signed)
Pt os a G6P3 at 39w 4d w/ c/o lower abdominal pressure beginning about 0700AM. Pt states the pressure comes and goes, but when the pressure comes she rates the pain 10/10 and is unable to walk. Pt states positive fetal movement. Pt denies LOF or vaginal bleeding. Monitors applied and assessing. Initial FHT 140.

## 2018-01-07 NOTE — Discharge Summary (Signed)
Meagan Morris is a 37 y.o. female. She is at [redacted]w[redacted]d gestation. Patient's last menstrual period was 03/18/2017. Estimated Date of Delivery: 01/10/18  Prenatal care site: [facility]   Chief complaint: abdominal pressure Location: lower abdomen Onset/timing: started this morning Duration: intermittent, lasting a few seconds, 3-4 times an hour Quality: pressure  Severity: moderate to severe Aggravating or alleviating conditions: sitting seems to relieve the pressure Associated signs/symptoms: none Context: Meagan Morris reports an intermittent abdominal pressure that started this morning. She reports that it comes and goes, lasting for brief periods of time a few times an hour. Other than sitting, nothing seems to make the pressure any better or worse. She has not taken any medication or done anything at home to try and treat the discomfort.   S: Resting comfortably.   She reports:  -active fetal movement -no leakage of fluid  -no vaginal bleeding -no contractions  Maternal Medical History:  History reviewed. No pertinent past medical history.  History reviewed. No pertinent surgical history.  No Known Allergies  Prior to Admission medications   Medication Sig Start Date End Date Taking? Authorizing Provider  famotidine (PEPCID) 10 MG tablet Take 10 mg by mouth 2 (two) times daily.   Yes [provider]     Social History: She  reports that she has never smoked. She has never used smokeless tobacco. She reports that she does not drink alcohol or use drugs.  Family History: family history includes Hypertension in her father and mother; Kidney disease in her father and mother.   Review of Systems: A full review of systems was performed and negative except as noted in the HPI.    O:  BP 124/70 (BP Location: Left Arm)   Pulse 84   Temp 97.9 F (36.6 C) (Oral)   Resp 18   Ht 5\' 5"  (1.651 m)   Wt 83 kg   LMP 03/18/2017   BMI 30.45 kg/m  Results for orders placed or  performed during the hospital encounter of 01/07/18 (from the past 48 hour(s))  Urinalysis, Complete w Microscopic   Collection Time: 01/07/18  5:11 PM  Result Value Ref Range   Color, Urine YELLOW (A) YELLOW   APPearance CLEAR (A) CLEAR   Specific Gravity, Urine 1.028 1.005 - 1.030   pH 5.0 5.0 - 8.0   Glucose, UA NEGATIVE NEGATIVE mg/dL   Hgb urine dipstick NEGATIVE NEGATIVE   Bilirubin Urine NEGATIVE NEGATIVE   Ketones, ur NEGATIVE NEGATIVE mg/dL   Protein, ur 30 (A) NEGATIVE mg/dL   Nitrite NEGATIVE NEGATIVE   Leukocytes, UA NEGATIVE NEGATIVE   WBC, UA 0-5 0 - 5 WBC/hpf   Bacteria, UA NONE SEEN NONE SEEN   Squamous Epithelial / LPF 6-10 0 - 5   Mucus PRESENT    Ca Oxalate Crys, UA PRESENT      Constitutional: NAD, AAOx3  HE/ENT: extraocular movements grossly intact, moist mucous membranes CV: RRR PULM: normal respiratory effort, CTABL     Abd: gravid, non-tender, non-distended, soft      Ext: Non-tender, Nonedmeatous   Psych: mood appropriate, speech normal Pelvic: FTP/30%/high posterior/soft per RN Benjaman Kindler  NST:  Baseline: 135 Variability: moderate Accelerations: 15x15 present x >2 Decelerations: absent Time: Toco: occasional contractions   A/P: 37 y.o. [redacted]w[redacted]d here for antenatal surveillance during pregnancy.  Principle diagnosis: abdominal pain during pregnancy  Labor  Not present  Fetal Wellbeing  Reactive NST, reassuring for GA  Abdominal pressure/discomfort  UA: no evidence of UTI  Warning signs and labor precautions reviewed. Reviewed comfort measures.   D/c home stable, precautions reviewed, follow-up as scheduled.   Advised to keep routine PNV for 01/13/2018 at [facility].    Genia Del 01/07/2018 5:51 PM  ----- Genia Del, CNM Certified Nurse Midwife [facility], Department of OB/GYN [facility]

## 2018-01-08 LAB — OB RESULTS CONSOLE VARICELLA ZOSTER ANTIBODY, IGG: VARICELLA IGG: IMMUNE

## 2018-01-08 LAB — OB RESULTS CONSOLE RUBELLA ANTIBODY, IGM: RUBELLA: IMMUNE

## 2018-01-09 ENCOUNTER — Other Ambulatory Visit: Payer: Self-pay | Admitting: Obstetrics and Gynecology

## 2018-01-09 NOTE — Progress Notes (Signed)
IOL orders for admit 

## 2018-01-10 ENCOUNTER — Observation Stay
Admission: EM | Admit: 2018-01-10 | Discharge: 2018-01-10 | Disposition: A | Discharge status: Home / Self Care | Payer: Medicaid Other | Source: Home / Self Care | Attending: Obstetrics and Gynecology | Admitting: Obstetrics and Gynecology

## 2018-01-10 ENCOUNTER — Other Ambulatory Visit: Payer: Self-pay

## 2018-01-10 DIAGNOSIS — O471 False labor at or after 37 completed weeks of gestation: Secondary | ICD-10-CM | POA: Diagnosis present

## 2018-01-10 LAB — INFLUENZA PANEL BY PCR (TYPE A & B)
INFLAPCR: NEGATIVE
Influenza B By PCR: NEGATIVE

## 2018-01-10 MED ORDER — GUAIFENESIN ER 600 MG PO TB12: 600.0000 mg | ORAL_TABLET | Freq: Two times a day (BID) | ORAL | 0 refills | Status: DC

## 2018-01-10 NOTE — Plan of Care (Signed)
Pt coughing during assessment.  Wearing a mask from ER department.  States has had cough/cold sx since Tuesday of this week.  Dry, hacking cough.  Non productive in nature.  Informed cnm and orders were received for rapid flu nasal swab.  Collected. Sent to lab as ordered.  Ellison Carwin, RNC

## 2018-01-10 NOTE — Discharge Summary (Signed)
Meagan Morris is a 37 y.o. female. She is at [redacted]w[redacted]d gestation. Patient's last menstrual period was 03/18/2017. Estimated Date of Delivery: 01/10/18  Prenatal care site:  [facility]   Current pregnancy complicated by:  GERD AMA Obesity Abnormal Pap-LSIL, HPV Pos  Chief complaint: Presented with abdominal pressure and bloody show since about 6am. Denies LOF, or DFM.  - has been sick with upper respiratory nasal congestion, HA and cough since Tuesday. Taking Robitussin and tylenol.    S: Resting comfortably but frequent episodes of coughing. no VB.no LOF,  Active fetal movement. Denies: HA, visual changes, SOB, or RUQ/epigastric pain  Maternal Medical History:  History reviewed. No pertinent past medical history.  History reviewed. No pertinent surgical history.  No Known Allergies  Prior to Admission medications   Medication Sig Start Date End Date Taking? Authorizing Provider  acetaminophen (TYLENOL) 325 MG tablet Take 2 tablets (650 mg total) by mouth every 4 (four) hours as needed for mild pain or moderate pain. 01/07/18  Yes Genia Del, CNM  famotidine (PEPCID) 10 MG tablet Take 10 mg by mouth 2 (two) times daily.   Yes [provider]  guaifenesin (ROBITUSSIN) 100 MG/5ML syrup Take 200 mg by mouth 3 (three) times daily as needed for cough.   Yes [provider]  menthol-cetylpyridinium (CEPACOL) 3 MG lozenge Take 1 lozenge (3 mg total) by mouth as needed for sore throat. 01/07/18  Yes Genia Del, CNM  guaiFENesin (MUCINEX) 600 MG 12 hr tablet Take 1 tablet (600 mg total) by mouth 2 (two) times daily. 01/10/18 01/17/19  , Prudencio Pair, CNM      Social History: She  reports that she has never smoked. She has never used smokeless tobacco. She reports that she does not drink alcohol or use drugs.  Family History: family history includes Hypertension in her father and mother; Kidney disease in her father and mother.   Review of Systems: A full  review of systems was performed and negative except as noted in the HPI.     O:  BP 118/73 (BP Location: Left Arm)   Pulse 73   Temp 98 F (36.7 C) (Oral)   Resp 16   Ht 5\' 5"  (1.651 m)   Wt 83 kg   LMP 03/18/2017   BMI 30.45 kg/m  Results for orders placed or performed during the hospital encounter of 01/10/18 (from the past 48 hour(s))  Influenza panel by PCR (type A & B)   Collection Time: 01/10/18  7:35 PM  Result Value Ref Range   Influenza A By PCR NEGATIVE NEGATIVE   Influenza B By PCR NEGATIVE NEGATIVE     Constitutional: NAD, AAOx3  HE/ENT: extraocular movements grossly intact, moist mucous membranes CV: RRR PULM: nl respiratory effort, CTABL     Abd: gravid, non-tender, non-distended, soft      Ext: Non-tender, Nonedematous   Psych: mood appropriate, speech normal Pelvic: SVE per nursing at 1730, 3/70/-2, soft/mid Repeat SVE per CNM: 2115: 3/80/-2, soft/mid No bloody show noted.   Fetal  monitoring: Cat I Appropriate for GA; Reactive NST Baseline: 135bpm Variability: moderate Accelerations:  present x >2 Decelerations absent Time: 1hr    A/P: 37 y.o. [redacted]w[redacted]d here for antenatal surveillance for contractions/bloody show  Principle Diagnosis:  False labor after 37wks   Labor: not present, no cervical change over 4hrs.   Fetal Wellbeing: Reassuring Cat 1 tracing, Reactive NST   D/c home stable, precautions reviewed, follow-up as scheduled; IOL scheduled next week.  Randa Ngo, CNM 01/10/2018  9:33 PM

## 2018-01-10 NOTE — OB Triage Note (Signed)
Pt is a G6P3 at [redacted]w[redacted]d w/ c/o vaginal bleeding and abdominal pressure. Pt states bleeding began about 6am this morning. Pt states she has had 3 ctx in 20 min. Pt states positive fetal movement. Pt denies a gush of fluid but mentioned increased discharge. Monitors applied and assessing. Initial FHT 140

## 2018-01-11 ENCOUNTER — Inpatient Hospital Stay: Payer: Medicaid Other | Admitting: Anesthesiology

## 2018-01-11 ENCOUNTER — Inpatient Hospital Stay
Admission: EM | Admit: 2018-01-11 | Discharge: 2018-01-13 | DRG: 797 | Disposition: A | Discharge status: Home / Self Care | Payer: Medicaid Other | Attending: Obstetrics and Gynecology | Admitting: Obstetrics and Gynecology

## 2018-01-11 DIAGNOSIS — Z3A40 40 weeks gestation of pregnancy: Secondary | ICD-10-CM | POA: Diagnosis not present

## 2018-01-11 DIAGNOSIS — O9081 Anemia of the puerperium: Secondary | ICD-10-CM | POA: Diagnosis not present

## 2018-01-11 DIAGNOSIS — Z302 Encounter for sterilization: Secondary | ICD-10-CM | POA: Diagnosis not present

## 2018-01-11 DIAGNOSIS — O99214 Obesity complicating childbirth: Secondary | ICD-10-CM | POA: Diagnosis present

## 2018-01-11 DIAGNOSIS — D62 Acute posthemorrhagic anemia: Secondary | ICD-10-CM | POA: Diagnosis not present

## 2018-01-11 DIAGNOSIS — K219 Gastro-esophageal reflux disease without esophagitis: Secondary | ICD-10-CM | POA: Diagnosis present

## 2018-01-11 DIAGNOSIS — O480 Post-term pregnancy: Principal | ICD-10-CM | POA: Diagnosis present

## 2018-01-11 DIAGNOSIS — O9962 Diseases of the digestive system complicating childbirth: Secondary | ICD-10-CM | POA: Diagnosis present

## 2018-01-11 DIAGNOSIS — E669 Obesity, unspecified: Secondary | ICD-10-CM | POA: Diagnosis present

## 2018-01-11 HISTORY — DX: Gastro-esophageal reflux disease without esophagitis: K21.9

## 2018-01-11 LAB — CBC
HCT: 35 % — ABNORMAL LOW (ref 36.0–46.0)
Hemoglobin: 11.3 g/dL — ABNORMAL LOW (ref 12.0–15.0)
MCH: 26.5 pg (ref 26.0–34.0)
MCHC: 32.3 g/dL (ref 30.0–36.0)
MCV: 82.2 fL (ref 80.0–100.0)
PLATELETS: 247 10*3/uL (ref 150–400)
RBC: 4.26 MIL/uL (ref 3.87–5.11)
RDW: 14.7 % (ref 11.5–15.5)
WBC: 8.7 10*3/uL (ref 4.0–10.5)
nRBC: 0 % (ref 0.0–0.2)

## 2018-01-11 LAB — TYPE AND SCREEN
ABO/RH(D): O POS
Antibody Screen: NEGATIVE

## 2018-01-11 MED ORDER — OXYTOCIN 40 UNITS IN LACTATED RINGERS INFUSION - SIMPLE MED: 2.5000 [IU]/h | INTRAVENOUS | Status: DC

## 2018-01-11 MED ORDER — BUTORPHANOL TARTRATE 1 MG/ML IJ SOLN
1.0000 mg | INTRAMUSCULAR | Status: DC | PRN
  Administered 2018-01-11: 1 mg via INTRAVENOUS
  Filled 2018-01-11: qty 1

## 2018-01-11 MED ORDER — FAMOTIDINE 20 MG PO TABS
40.0000 mg | ORAL_TABLET | Freq: Every day | ORAL | Status: DC
  Administered 2018-01-11 – 2018-01-13 (×3): 40 mg via ORAL
  Filled 2018-01-11 (×3): qty 2

## 2018-01-11 MED ORDER — FENTANYL 2.5 MCG/ML W/ROPIVACAINE 0.15% IN NS 100 ML EPIDURAL (ARMC)
12.0000 mL/h | EPIDURAL | Status: DC
  Administered 2018-01-11: 12 mL/h via EPIDURAL

## 2018-01-11 MED ORDER — ACETAMINOPHEN 325 MG PO TABS
650.0000 mg | ORAL_TABLET | ORAL | Status: DC | PRN
  Administered 2018-01-11 (×2): 650 mg via ORAL
  Filled 2018-01-11: qty 2

## 2018-01-11 MED ORDER — ONDANSETRON HCL 4 MG/2ML IJ SOLN: 4.0000 mg | Freq: Four times a day (QID) | INTRAMUSCULAR | Status: DC | PRN

## 2018-01-11 MED ORDER — LIDOCAINE HCL (PF) 1 % IJ SOLN: 30.0000 mL | INTRAMUSCULAR | Status: DC | PRN

## 2018-01-11 MED ORDER — OXYTOCIN BOLUS FROM INFUSION
500.0000 mL | Freq: Once | INTRAVENOUS | Status: AC
  Administered 2018-01-11: 500 mL via INTRAVENOUS

## 2018-01-11 MED ORDER — FENTANYL 2.5 MCG/ML W/ROPIVACAINE 0.15% IN NS 100 ML EPIDURAL (ARMC)
EPIDURAL | Status: AC
  Filled 2018-01-11: qty 100

## 2018-01-11 MED ORDER — BUPIVACAINE HCL (PF) 0.25 % IJ SOLN
INTRAMUSCULAR | Status: DC | PRN
  Administered 2018-01-11: 5 mL via EPIDURAL

## 2018-01-11 MED ORDER — MISOPROSTOL 200 MCG PO TABS
800.0000 ug | ORAL_TABLET | Freq: Once | ORAL | Status: AC
  Administered 2018-01-11: 800 ug via RECTAL

## 2018-01-11 MED ORDER — LACTATED RINGERS IV SOLN: 500.0000 mL | INTRAVENOUS | Status: DC | PRN

## 2018-01-11 MED ORDER — LIDOCAINE HCL (PF) 1 % IJ SOLN
INTRAMUSCULAR | Status: AC
  Filled 2018-01-11: qty 30

## 2018-01-11 MED ORDER — EPHEDRINE 5 MG/ML INJ
10.0000 mg | INTRAVENOUS | Status: DC | PRN
  Filled 2018-01-11: qty 2

## 2018-01-11 MED ORDER — ACETAMINOPHEN 325 MG PO TABS
650.0000 mg | ORAL_TABLET | ORAL | Status: DC | PRN
  Administered 2018-01-12: 650 mg via ORAL
  Filled 2018-01-11 (×2): qty 2

## 2018-01-11 MED ORDER — PHENYLEPHRINE 40 MCG/ML (10ML) SYRINGE FOR IV PUSH (FOR BLOOD PRESSURE SUPPORT)
80.0000 ug | PREFILLED_SYRINGE | INTRAVENOUS | Status: DC | PRN
  Filled 2018-01-11: qty 5

## 2018-01-11 MED ORDER — TERBUTALINE SULFATE 1 MG/ML IJ SOLN: 0.2500 mg | Freq: Once | INTRAMUSCULAR | Status: DC | PRN

## 2018-01-11 MED ORDER — PRENATAL MULTIVITAMIN CH
1.0000 | ORAL_TABLET | Freq: Every day | ORAL | Status: DC
  Administered 2018-01-13: 1 via ORAL
  Filled 2018-01-11: qty 1

## 2018-01-11 MED ORDER — OXYTOCIN 10 UNIT/ML IJ SOLN
INTRAMUSCULAR | Status: AC
  Filled 2018-01-11: qty 2

## 2018-01-11 MED ORDER — ONDANSETRON HCL 4 MG/2ML IJ SOLN
4.0000 mg | INTRAMUSCULAR | Status: DC | PRN
  Administered 2018-01-12: 4 mg via INTRAVENOUS

## 2018-01-11 MED ORDER — OXYTOCIN 40 UNITS IN LACTATED RINGERS INFUSION - SIMPLE MED: 1.0000 m[IU]/min | INTRAVENOUS | Status: DC

## 2018-01-11 MED ORDER — WITCH HAZEL-GLYCERIN EX PADS
1.0000 "application " | MEDICATED_PAD | CUTANEOUS | Status: DC | PRN
  Administered 2018-01-12: 1 via TOPICAL
  Filled 2018-01-11: qty 100

## 2018-01-11 MED ORDER — SOD CITRATE-CITRIC ACID 500-334 MG/5ML PO SOLN: 30.0000 mL | ORAL | Status: DC | PRN

## 2018-01-11 MED ORDER — MISOPROSTOL 25 MCG QUARTER TABLET: 25.0000 ug | ORAL_TABLET | ORAL | Status: DC | PRN

## 2018-01-11 MED ORDER — LACTATED RINGERS IV SOLN: 500.0000 mL | Freq: Once | INTRAVENOUS | Status: DC

## 2018-01-11 MED ORDER — AMMONIA AROMATIC IN INHA
RESPIRATORY_TRACT | Status: AC
  Filled 2018-01-11: qty 10

## 2018-01-11 MED ORDER — BENZOCAINE-MENTHOL 20-0.5 % EX AERO
1.0000 "application " | INHALATION_SPRAY | CUTANEOUS | Status: DC | PRN
  Administered 2018-01-12: 1 via TOPICAL
  Filled 2018-01-11: qty 56

## 2018-01-11 MED ORDER — DIPHENHYDRAMINE HCL 25 MG PO CAPS: 25.0000 mg | ORAL_CAPSULE | Freq: Four times a day (QID) | ORAL | Status: DC | PRN

## 2018-01-11 MED ORDER — SIMETHICONE 80 MG PO CHEW: 80.0000 mg | CHEWABLE_TABLET | ORAL | Status: DC | PRN

## 2018-01-11 MED ORDER — DIBUCAINE 1 % RE OINT
1.0000 "application " | TOPICAL_OINTMENT | RECTAL | Status: DC | PRN
  Administered 2018-01-12: 1 via RECTAL
  Filled 2018-01-11: qty 28

## 2018-01-11 MED ORDER — OXYTOCIN 40 UNITS IN LACTATED RINGERS INFUSION - SIMPLE MED
INTRAVENOUS | Status: AC
  Filled 2018-01-11: qty 1000

## 2018-01-11 MED ORDER — IBUPROFEN 600 MG PO TABS
ORAL_TABLET | ORAL | Status: AC
  Administered 2018-01-11: 600 mg
  Filled 2018-01-11: qty 1

## 2018-01-11 MED ORDER — SENNOSIDES-DOCUSATE SODIUM 8.6-50 MG PO TABS
2.0000 | ORAL_TABLET | ORAL | Status: DC
  Administered 2018-01-13: 2 via ORAL
  Filled 2018-01-11 (×2): qty 2

## 2018-01-11 MED ORDER — ZOLPIDEM TARTRATE 5 MG PO TABS: 5.0000 mg | ORAL_TABLET | Freq: Every evening | ORAL | Status: DC | PRN

## 2018-01-11 MED ORDER — LACTATED RINGERS IV SOLN
INTRAVENOUS | Status: DC
  Administered 2018-01-11: 05:00:00 via INTRAVENOUS

## 2018-01-11 MED ORDER — ONDANSETRON HCL 4 MG PO TABS: 4.0000 mg | ORAL_TABLET | ORAL | Status: DC | PRN

## 2018-01-11 MED ORDER — COCONUT OIL OIL
1.0000 "application " | TOPICAL_OIL | Status: DC | PRN
  Filled 2018-01-11: qty 120

## 2018-01-11 MED ORDER — IBUPROFEN 600 MG PO TABS
600.0000 mg | ORAL_TABLET | Freq: Four times a day (QID) | ORAL | Status: DC
  Administered 2018-01-11 – 2018-01-13 (×4): 600 mg via ORAL
  Filled 2018-01-11 (×5): qty 1

## 2018-01-11 MED ORDER — DIPHENHYDRAMINE HCL 50 MG/ML IJ SOLN: 12.5000 mg | INTRAMUSCULAR | Status: DC | PRN

## 2018-01-11 NOTE — Anesthesia Preprocedure Evaluation (Signed)
Anesthesia Evaluation  Patient identified by MRN, date of birth, ID band Patient awake    Reviewed: Allergy & Precautions, NPO status , Patient's Chart, lab work & pertinent test results, reviewed documented beta blocker date and time   Airway Mallampati: II  TM Distance: >3 FB     Dental  (+) Chipped   Pulmonary           Cardiovascular      Neuro/Psych    GI/Hepatic   Endo/Other    Renal/GU      Musculoskeletal   Abdominal   Peds  Hematology   Anesthesia Other Findings   Reproductive/Obstetrics                             Anesthesia Physical Anesthesia Plan  ASA: II  Anesthesia Plan: Epidural   Post-op Pain Management:    Induction:   PONV Risk Score and Plan:   Airway Management Planned:   Additional Equipment:   Intra-op Plan:   Post-operative Plan:   Informed Consent: I have reviewed the patients History and Physical, chart, labs and discussed the procedure including the risks, benefits and alternatives for the proposed anesthesia with the patient or authorized representative who has indicated his/her understanding and acceptance.     Plan Discussed with: CRNA  Anesthesia Plan Comments:         Anesthesia Quick Evaluation  

## 2018-01-11 NOTE — Discharge Summary (Signed)
Obstetrical Discharge Summary  Patient Name: Meagan Morris DOB: [birth year] MRN: [id number]  Date of Admission: 01/11/2018 Date of Delivery: 01/11/2018 Delivered by: Bonnell Public CNM Date of Discharge: 01/13/2018  Primary OB:  [facility]  WUJ:[id number] last menstrual period was 03/18/2017. EDC Estimated Date of Delivery: 01/10/18 Gestational Age at Delivery: [redacted]w[redacted]d   Antepartum complications:  1. GERD 2. AMA 3. Obesity 4. Abnormal Pap-LSIL, HPV Pos  Admitting Diagnosis: term preg 40-42weeks Secondary Diagnosis: SVD with 1st deg lac  Patient Active Problem List   Diagnosis Date Noted  . Post-term pregnancy, 40-42 weeks of gestation 01/11/2018  . False labor after 37 weeks of gestation without delivery 01/10/2018  . Abdominal pain affecting pregnancy 01/07/2018  . Pregnancy 12/05/2017  . Threatened preterm labor, antepartum 12/04/2017  . Advanced maternal age in multigravida, first trimester     Augmentation: AROM Complications: None Intrapartum complications/course: admitted in active labor, AROM at 9cm, pushed with 1 UC, for SVD of viable female. Initially mild uterine atony and firmed with massage, Cytotec given.  Date of Delivery: 01/11/18 Delivered By:  Bonnell Public CNM Delivery Type: spontaneous vaginal delivery Anesthesia: epidural Placenta: spontaneous Laceration: 1st deg perineal Episiotomy: none  Newborn Data: Live born female "Gael" Birth Weight:  3770g, 8lb 5oz APGAR: 8, 9  Newborn Delivery   Birth date/time:  01/11/2018 13:10:00 Delivery type:  Vaginal, Spontaneous       Postpartum Procedures: none  Post partum course:  Patient had an uncomplicated postpartum course.  She had a tubal ligation on postpartum day 1. By time of discharge on PPD#2, her pain was controlled on oral pain medications; she had appropriate lochia and was ambulating, voiding without difficulty and tolerating regular diet.  She was deemed stable for discharge to home.    Discharge Physical  Exam:  BP (!) 103/50 (BP Location: Left Arm)   Pulse (!) 53   Temp 97.9 F (36.6 C) (Oral)   Resp 17   Ht 5\' 5"  (1.651 m)   Wt 83 kg   LMP 03/18/2017   SpO2 97%   Breastfeeding? Unknown Comment: recently pregnant  BMI 30.45 kg/m   General: NAD CV: RRR Pulm: CTABL, nl effort ABD: s/nd/nt, fundus firm and below the umbilicus Incision: C/D/I Lochia: moderate DVT Evaluation: LE non-ttp, no evidence of DVT on exam.  Hemoglobin  Date Value Ref Range Status  01/13/2018 10.0 (L) 12.0 - 15.0 g/dL Final  29/56/2130 86.5  Final   HCT  Date Value Ref Range Status  01/13/2018 31.6 (L) 36.0 - 46.0 % Final     Disposition: stable, discharge to home. Baby Feeding: formula Baby Disposition: home with mom  Rh Immune globulin given: n/a Rubella vaccine given: n/a Varicella vaccine given: n/a Tdap vaccine given in AP setting: 10/14/17 Flu vaccine given in AP setting: 12/23/17  Contraception: BTL  Prenatal Labs:  Blood type/Rh  O pos  Antibody screen neg  Rubella Immune  Varicella Immune  RPR NR  HBsAg Neg  HIV NR  GC neg  Chlamydia neg  Genetic screening negative  1 hour GTT  83  GBS  neg    Plan:  Meagan Morris was discharged to home in good condition. Follow-up appointment with delivering provider in 5-6 weeks.  Discharge Medications: Allergies as of 01/13/2018   No Known Allergies     Medication List    TAKE these medications   acetaminophen 325 MG tablet Commonly known as:  TYLENOL Take 2 tablets (650 mg total) by mouth every 4 (  four) hours as needed for mild pain or moderate pain.   famotidine 10 MG tablet Commonly known as:  PEPCID Take 10 mg by mouth 2 (two) times daily.   guaiFENesin 600 MG 12 hr tablet Commonly known as:  MUCINEX Take 1 tablet (600 mg total) by mouth 2 (two) times daily.   ibuprofen 600 MG tablet Commonly known as:  ADVIL,MOTRIN Take 1 tablet (600 mg total) by mouth every 6 (six) hours.   menthol-cetylpyridinium 3 MG  lozenge Commonly known as:  CEPACOL Take 1 lozenge (3 mg total) by mouth as needed for sore throat.       Follow-up Information    McVey, Prudencio Pair, CNM Follow up in 5 week(s).   Specialty:  Obstetrics and Gynecology Contact information: [address] [place] [address] [phone]           Signed:  ----- Ranae Plumber, MD Attending Obstetrician and Gynecologist [facility], Department of OB/GYN [facility]

## 2018-01-11 NOTE — H&P (Signed)
OB History & Physical   History of Present Illness:  Chief Complaint: more painful UCs  HPI:  Meagan Morris is a 37 y.o. [id number] female at [redacted]w[redacted]d dated by LMP and c/w [place].  She presents to L&D for more painful UCs since dc earlier today around 1930. Reports good FM, scant bloody show and no LOF.    Pregnancy Issues: 1. GERD 2. AMA 3. Obesity 4. Abnormal Pap-LSIL, HPV Pos   Maternal Medical History:  History reviewed. No pertinent past medical history.  History reviewed. No pertinent surgical history.  No Known Allergies  Prior to Admission medications   Medication Sig Start Date End Date Taking? Authorizing Provider  acetaminophen (TYLENOL) 325 MG tablet Take 2 tablets (650 mg total) by mouth every 4 (four) hours as needed for mild pain or moderate pain. 01/07/18   Genia Del, CNM  famotidine (PEPCID) 10 MG tablet Take 10 mg by mouth 2 (two) times daily.    [provider]  guaiFENesin (MUCINEX) 600 MG 12 hr tablet Take 1 tablet (600 mg total) by mouth 2 (two) times daily. 01/10/18 01/17/19  , Prudencio Pair, CNM  menthol-cetylpyridinium (CEPACOL) 3 MG lozenge Take 1 lozenge (3 mg total) by mouth as needed for sore throat. 01/07/18   Genia Del, CNM     [facility]renatal care site: [facility]   Social History: She  reports that she has never smoked. She has never used smokeless tobacco. She reports that she does not drink alcohol or use drugs.  Family History: family history includes Hypertension in her father and mother; Kidney disease in her father and mother.   Review of Systems: A full review of systems was performed and negative except as noted in the HPI.     Physical Exam:  Vital Signs: BP 132/81 (BP Location: Left Arm)   Pulse 65   Temp (!) 96.8 F (36 C) (Oral)   Resp 20   Ht 5\' 5"  (1.651 m)   Wt 83 kg   LMP 03/18/2017   BMI 30.45 kg/m  General: no acute distress.  HEENT: normocephalic, atraumatic Heart: regular rate  & rhythm.  No murmurs/rubs/gallops Lungs: clear to auscultation bilaterally, normal respiratory effort Abdomen: soft, gravid, non-tender;  EFW: 7.5lbs Pelvic:   External: Normal external female genitalia  Cervix: Dilation: 6 / Effacement (%): 80 / Station: -2    Extremities: non-tender, symmetric, no edema bilaterally.  DTRs: 2+  Neurologic: Alert & oriented x 3.    Results for orders placed or performed during the hospital encounter of 01/10/18 (from the past 24 hour(s))  Influenza panel by PCR (type A & B)     Status: None   Collection Time: 01/10/18  7:35 PM  Result Value Ref Range   Influenza A By PCR NEGATIVE NEGATIVE   Influenza B By PCR NEGATIVE NEGATIVE    Pertinent Results:  Prenatal Labs: Blood type/Rh  O pos  Antibody screen neg  Rubella Immune  Varicella Immune  RPR NR  HBsAg Neg  HIV NR  GC neg  Chlamydia neg  Genetic screening negative  1 hour GTT  83  GBS  neg   FHT: 125bpm, mod variability, + accels, no decels TOCO: 1.5-90min SVE:  Dilation: 6 / Effacement (%): 80 / Station: -2    Cephalic by leopolds  No results found.  Assessment:  Meagan Morris is a 37 y.o. [phone] female at [redacted]w[redacted]d with active labor  Plan:  1. Admit to Labor & Delivery; consents  reviewed and obtained  2. Fetal Well being  - Fetal Tracing: Cat I tracing - Group B Streptococcus ppx indicated: negative - Presentation: cephalic confirmed by exam and Leopolds   3. Routine OB: - Prenatal labs reviewed, as above - Rh O Pos - CBC, T&S, RPR on admit - Clear fluids, IVF  4. Monitoring of Labor:  -  Contractions: external toco in place -  Pelvis proven to 7.5lbs -  Plan for continuous fetal monitoring  -  Maternal pain control as desired; requesting IVPM, nitrous, regional anesthesia - Anticipate vaginal delivery   Randa Ngo, CNM 01/11/18 4:11 AM

## 2018-01-11 NOTE — Progress Notes (Signed)
Patient was offered Spanish interpreter throughout shift.  Pt declines interpreter   Encouraged to request interpreter if she does not understand any information provided.  Pt verbalizes understanding and agreement. Reynold Bowen, RN 01/11/2018

## 2018-01-11 NOTE — Progress Notes (Signed)
Labor Progress Note  Meagan Morris is a 37 y.o. [phone] at [redacted]w[redacted]d admitted in active labor this am.   Subjective: comfortable with epidural, continues to cough, no fever, chills.   Objective: BP 111/67   Pulse 78   Temp 98.2 F (36.8 C) (Oral)   Resp 16   Ht 5\' 5"  (1.651 m)   Wt 83 kg   LMP 03/18/2017   SpO2 97%   BMI 30.45 kg/m  Notable VS details: reviewed  Fetal Assessment: FHT:  FHR: 120 bpm, variability: moderate,  accelerations:  Present,  decelerations:  Absent Category/reactivity:  Category I UC:   regular, every 4-5 minutes SVE:   9/C/-1 with BBOW; AROM performed clear fluid.  Membrane status: AROM at 1012 Amniotic color: Clear  Labs: Lab Results  Component Value Date   WBC 8.7 01/11/2018   HGB 11.3 (L) 01/11/2018   HCT 35.0 (L) 01/11/2018   MCV 82.2 01/11/2018   PLT 247 01/11/2018    Assessment / Plan: Spontaneous labor, progressing normally  Labor: Progressing normally, now AROM performed.  Fetal Wellbeing:  Category I Pain Control:  Epidural I/D:  n/a Anticipated MOD:  NSVD  Prudencio Pair , CNM 01/11/2018, 10:16 AM

## 2018-01-11 NOTE — Anesthesia Procedure Notes (Signed)
Epidural Patient location during procedure: OB  Staffing Anesthesiologist: Lamarkus Nebel, MD Performed: anesthesiologist   Preanesthetic Checklist Completed: patient identified, site marked, surgical consent, pre-op evaluation, timeout performed, IV checked, risks and benefits discussed and monitors and equipment checked  Epidural Patient position: sitting Prep: ChloraPrep Patient monitoring: heart rate, continuous pulse ox and blood pressure Approach: midline Location: L4-L5 Injection technique: LOR saline  Needle:  Needle type: Tuohy  Needle gauge: 18 G Needle length: 9 cm and 9 Catheter type: closed end flexible Catheter size: 20 Guage Test dose: negative and 1.5% lidocaine with Epi 1:200 K  Assessment Sensory level: T10 Events: blood not aspirated, injection not painful, no injection resistance, negative IV test and no paresthesia  Additional Notes   Patient tolerated the insertion well without complications.Reason for block:procedure for pain     

## 2018-01-11 NOTE — Plan of Care (Signed)
Patient delivered vaginally at 1310 by R.Mcvey CNM a viable female infant.

## 2018-01-12 ENCOUNTER — Encounter: Payer: Self-pay | Admitting: Anesthesiology

## 2018-01-12 ENCOUNTER — Inpatient Hospital Stay: Payer: Medicaid Other | Admitting: Anesthesiology

## 2018-01-12 ENCOUNTER — Encounter
Admission: EM | Disposition: A | Discharge status: Home / Self Care | Payer: Self-pay | Source: Home / Self Care | Attending: Obstetrics and Gynecology

## 2018-01-12 HISTORY — PX: TUBAL LIGATION: SHX77

## 2018-01-12 LAB — CBC
HEMATOCRIT: 31 % — AB (ref 36.0–46.0)
HEMOGLOBIN: 10.1 g/dL — AB (ref 12.0–15.0)
MCH: 26.9 pg (ref 26.0–34.0)
MCHC: 32.6 g/dL (ref 30.0–36.0)
MCV: 82.4 fL (ref 80.0–100.0)
Platelets: 213 10*3/uL (ref 150–400)
RBC: 3.76 MIL/uL — ABNORMAL LOW (ref 3.87–5.11)
RDW: 14.8 % (ref 11.5–15.5)
WBC: 9.5 10*3/uL (ref 4.0–10.5)
nRBC: 0 % (ref 0.0–0.2)

## 2018-01-12 SURGERY — LIGATION, FALLOPIAN TUBE, POSTPARTUM
Anesthesia: General | Laterality: Bilateral

## 2018-01-12 MED ORDER — DEXAMETHASONE SODIUM PHOSPHATE 10 MG/ML IJ SOLN
INTRAMUSCULAR | Status: DC | PRN
  Administered 2018-01-12: 10 mg via INTRAVENOUS

## 2018-01-12 MED ORDER — ONDANSETRON HCL 4 MG/2ML IJ SOLN: 4.0000 mg | Freq: Once | INTRAMUSCULAR | Status: DC | PRN

## 2018-01-12 MED ORDER — SUCCINYLCHOLINE CHLORIDE 20 MG/ML IJ SOLN
INTRAMUSCULAR | Status: DC | PRN
  Administered 2018-01-12: 100 mg via INTRAVENOUS

## 2018-01-12 MED ORDER — FENTANYL CITRATE (PF) 100 MCG/2ML IJ SOLN
INTRAMUSCULAR | Status: AC
  Filled 2018-01-12: qty 2

## 2018-01-12 MED ORDER — ROCURONIUM BROMIDE 100 MG/10ML IV SOLN
INTRAVENOUS | Status: DC | PRN
  Administered 2018-01-12: 30 mg via INTRAVENOUS

## 2018-01-12 MED ORDER — BUPIVACAINE HCL 0.5 % IJ SOLN
INTRAMUSCULAR | Status: DC | PRN
  Administered 2018-01-12: 7 mL

## 2018-01-12 MED ORDER — BUPIVACAINE HCL (PF) 0.5 % IJ SOLN
INTRAMUSCULAR | Status: AC
  Filled 2018-01-12: qty 30

## 2018-01-12 MED ORDER — LACTATED RINGERS IV SOLN
INTRAVENOUS | Status: DC | PRN
  Administered 2018-01-12: 14:00:00 via INTRAVENOUS

## 2018-01-12 MED ORDER — FENTANYL CITRATE (PF) 100 MCG/2ML IJ SOLN
25.0000 ug | INTRAMUSCULAR | Status: DC | PRN
  Administered 2018-01-12 (×4): 25 ug via INTRAVENOUS

## 2018-01-12 MED ORDER — SUGAMMADEX SODIUM 200 MG/2ML IV SOLN
INTRAVENOUS | Status: DC | PRN
  Administered 2018-01-12: 200 mg via INTRAVENOUS

## 2018-01-12 MED ORDER — LIDOCAINE HCL (CARDIAC) PF 100 MG/5ML IV SOSY
PREFILLED_SYRINGE | INTRAVENOUS | Status: DC | PRN
  Administered 2018-01-12: 100 mg via INTRAVENOUS

## 2018-01-12 MED ORDER — PROPOFOL 10 MG/ML IV BOLUS
INTRAVENOUS | Status: DC | PRN
  Administered 2018-01-12: 100 mg via INTRAVENOUS

## 2018-01-12 MED ORDER — PROPOFOL 500 MG/50ML IV EMUL
INTRAVENOUS | Status: AC
  Filled 2018-01-12: qty 50

## 2018-01-12 MED ORDER — FENTANYL CITRATE (PF) 100 MCG/2ML IJ SOLN
INTRAMUSCULAR | Status: DC | PRN
  Administered 2018-01-12: 100 ug via INTRAVENOUS

## 2018-01-12 SURGICAL SUPPLY — 32 items
BLADE SURG SZ11 CARB STEEL (BLADE) ×3 IMPLANT
CANISTER SUCT 1200ML W/VALVE (MISCELLANEOUS) ×3 IMPLANT
CHLORAPREP W/TINT 26ML (MISCELLANEOUS) ×3 IMPLANT
COVER WAND RF STERILE (DRAPES) ×3 IMPLANT
DERMABOND ADVANCED (GAUZE/BANDAGES/DRESSINGS) ×2
DERMABOND ADVANCED .7 DNX12 (GAUZE/BANDAGES/DRESSINGS) ×1 IMPLANT
DRAPE LAPAROTOMY 100X77 ABD (DRAPES) ×3 IMPLANT
DRSG OPSITE POSTOP 3X4 (GAUZE/BANDAGES/DRESSINGS) ×3 IMPLANT
ELECT REM PT RETURN 9FT ADLT (ELECTROSURGICAL) ×3
ELECTRODE REM PT RTRN 9FT ADLT (ELECTROSURGICAL) ×1 IMPLANT
GLOVE BIO SURGEON STRL SZ7 (GLOVE) ×9 IMPLANT
GLOVE INDICATOR 7.5 STRL GRN (GLOVE) ×3 IMPLANT
GOWN STRL REUS W/ TWL LRG LVL3 (GOWN DISPOSABLE) ×1 IMPLANT
GOWN STRL REUS W/TWL LRG LVL3 (GOWN DISPOSABLE) ×2
KIT TURNOVER CYSTO (KITS) ×3 IMPLANT
LABEL OR SOLS (LABEL) ×3 IMPLANT
LIGASURE IMPACT 36 18CM CVD LR (INSTRUMENTS) IMPLANT
NEEDLE HYPO 22GX1.5 SAFETY (NEEDLE) ×3 IMPLANT
NS IRRIG 500ML POUR BTL (IV SOLUTION) ×3 IMPLANT
PACK BASIN MINOR ARMC (MISCELLANEOUS) ×3 IMPLANT
PAD OB MATERNITY 4.3X12.25 (PERSONAL CARE ITEMS) ×3 IMPLANT
RETRACTOR WOUND ALXS 18CM SML (MISCELLANEOUS) ×1 IMPLANT
RTRCTR WOUND ALEXIS O 18CM SML (MISCELLANEOUS) ×3
SPONGE LAP 4X18 RFD (DISPOSABLE) ×3 IMPLANT
SUT CHROMIC GUT BROWN 0 54 (SUTURE) ×1 IMPLANT
SUT CHROMIC GUT BROWN 0 54IN (SUTURE) ×3
SUT MNCRL 4-0 (SUTURE) ×2
SUT MNCRL 4-0 27XMFL (SUTURE) ×1
SUT VIC AB 0 CT2 27 (SUTURE) ×3 IMPLANT
SUT VICRYL 0 AB UR-6 (SUTURE) ×6 IMPLANT
SUTURE MNCRL 4-0 27XMF (SUTURE) ×1 IMPLANT
SYR 10ML LL (SYRINGE) ×3 IMPLANT

## 2018-01-12 NOTE — Anesthesia Preprocedure Evaluation (Signed)
Anesthesia Evaluation  Patient identified by MRN, date of birth, ID band Patient awake    Reviewed: Allergy & Precautions, NPO status , Patient's Chart, lab work & pertinent test results, reviewed documented beta blocker date and time   Airway Mallampati: II  TM Distance: >3 FB     Dental  (+) Chipped   Pulmonary neg pulmonary ROS,           Cardiovascular negative cardio ROS       Neuro/Psych negative neurological ROS  negative psych ROS   GI/Hepatic Neg liver ROS, GERD  ,  Endo/Other  negative endocrine ROS  Renal/GU negative Renal ROS  negative genitourinary   Musculoskeletal negative musculoskeletal ROS (+)   Abdominal   Peds negative pediatric ROS (+)  Hematology negative hematology ROS (+)   Anesthesia Other Findings   Reproductive/Obstetrics                             Anesthesia Physical  Anesthesia Plan  ASA: II  Anesthesia Plan: General   Post-op Pain Management:    Induction: Intravenous, Rapid sequence and Cricoid pressure planned  PONV Risk Score and Plan:   Airway Management Planned: Oral ETT  Additional Equipment:   Intra-op Plan:   Post-operative Plan: Extubation in OR  Informed Consent: I have reviewed the patients History and Physical, chart, labs and discussed the procedure including the risks, benefits and alternatives for the proposed anesthesia with the patient or authorized representative who has indicated his/her understanding and acceptance.   Dental advisory given  Plan Discussed with: CRNA and Surgeon  Anesthesia Plan Comments:         Anesthesia Quick Evaluation

## 2018-01-12 NOTE — Op Note (Signed)
Meagan Morris 01/12/2018  PREOPERATIVE DIAGNOSES: Multiparity, undesired fertility  POSTOPERATIVE DIAGNOSES: Multiparity, undesired fertility  PROCEDURE:  Postpartum Bilateral Tubal Sterilization by bilateral partial salpingectomy, including fimbriae  SURGEON: Dr. Christeen Douglas  ANESTHESIA:  GETA and local analgesia using 10 ml of 0.5% Marcaine  Anesthesiologist: Yves Dill, MD Anesthesiologist: Yves Dill, MD CRNA: Eduardo Osier, CRNA  COMPLICATIONS:  None immediate.  ESTIMATED BLOOD LOSS: minimal.  FLUIDS: see anesthesia records  URINE OUTPUT:  Not assessed  INDICATIONS:  37 y.o. [id number] with undesired fertility,status post vaginal delivery, desires permanent sterilization.  Other reversible forms of contraception were discussed with patient; she declines all other modalities. Risks of procedure discussed with patient including but not limited to: risk of regret, permanence of method, bleeding, infection, injury to surrounding organs and need for additional procedures.  Failure risk of 1 -2 % with increased risk of ectopic gestation if pregnancy occurs was also discussed with patient.      FINDINGS:  Normal uterus, tubes, and ovaries.  PROCEDURE DETAILS:  The patient was taken to the operating room where her epidural anesthesia was dosed up to surgical level and found to be adequate. She was then placed in the dorsal supine position and prepped and draped in sterile fashion. After an adequate timeout was performed, attention was turned to the patient's abdomen where a small transverse skin incision was made under the umbilical fold. The incision was taken down to the layer of fascia using the scalpel, and fascia was incised, and extended bilaterally using Mayo scissors. The peritoneum was entered in a sharp fashion. Attention was then turned to the patient's uterus, and left fallopian tube was identified and followed out to the fimbriated end. Using a Ligasure  bipolar cautery device, a portion of the left tube including the fimbriated end was cauterized and sharply excised. A similar process was carried out on the right side allowing for bilateral tubal sterilization. Good hemostasis was noted overall. Local analgesia was poured over both adenexa.The instruments were then removed from the patient's abdomen and the fascial incision was repaired with 0 Vicryl, and the skin was closed with a 4-0 Vicryl subcuticular stitch. The patient tolerated the procedure well. Instrument, sponge, and needle counts were correct times two. The patient was then taken to the recovery room awake and in stable condition.

## 2018-01-12 NOTE — Progress Notes (Signed)
15 minute call to floor. 

## 2018-01-12 NOTE — Progress Notes (Signed)
Patient desires permanent sterilization.  Other reversible forms of contraception were discussed with patient; she declines all other modalities. Risks of procedure discussed with patient including but not limited to: risk of regret, permanence of method, bleeding, infection, injury to surrounding organs and need for additional procedures.  Failure risk of 1-2 % with increased risk of ectopic gestation if pregnancy occurs was also discussed with patient.  Patient verbalized understanding of these risks and wants to proceed with sterilization.  Written informed consent obtained.  To OR when ready.  Tuabl ligation  

## 2018-01-12 NOTE — Anesthesia Postprocedure Evaluation (Signed)
Anesthesia Post Note  Patient: Meagan Morris  Procedure(s) Performed: AN AD HOC LABOR EPIDURAL  Patient location during evaluation: Mother Baby Anesthesia Type: Epidural Level of consciousness: awake and alert and oriented Pain management: pain level controlled Vital Signs Assessment: post-procedure vital signs reviewed and stable Respiratory status: spontaneous breathing Cardiovascular status: blood pressure returned to baseline Postop Assessment: no headache and no backache Anesthetic complications: no     Last Vitals:  Vitals:   01/12/18 1716 01/12/18 2107  BP: 114/70 (!) 104/58  Pulse: (!) 53 (!) 57  Resp: 16 20  Temp: (!) 36.4 C 36.9 C  SpO2: 96% 96%    Last Pain:  Vitals:   01/12/18 2110  TempSrc:   PainSc: 5                  Meagan Morris

## 2018-01-12 NOTE — Progress Notes (Signed)
Post Partum Day 1 Subjective: Doing well, no complaints. S/p BTL done today. Tolerating regular diet, pain with PO meds, voiding and ambulating without difficulty.   No CP SOB Fever,Chills, N/V or leg pain; denies nipple or breast pain no HA change of vision, RUQ/epigastric pain. continues to have some nasal congestion and cough, but much improved.   Objective: BP 114/70 (BP Location: Right Arm)   Pulse (!) 53   Temp (!) 97.5 F (36.4 C) (Oral)   Resp 16   Ht 5\' 5"  (1.651 m)   Wt 83 kg   LMP 03/18/2017   SpO2 96%   Breastfeeding? Unknown Comment: recently pregnant  BMI 30.45 kg/m    Physical Exam:  General: NAD Breasts: soft/nontender CV: RRR Pulm: nl effort, CTABL Abdomen: soft, NT, BS x 4 Incision: periumbilical incision: honeycomb dsg CDI- no erythema or drainage noted.  Perineum: minimal edema, repair well approximated Lochia: small Uterine Fundus: fundus firm and 2 fb below umbilicus DVT Evaluation: no cords, ttp LEs   Recent Labs    01/11/18 0410 01/12/18 0535  HGB 11.3* 10.1*  HCT 35.0* 31.0*  WBC 8.7 9.5  PLT 247 213    Assessment/Plan: 37 y.o. [id number] postpartum day # 1 and post-op day 0  - Continue routine PP care - Lactation consult prn, pt is breast and bottlefeeding infant - BTL done today.  - mild blood loss anemia - hemodynamically stable and asymptomatic - Immunization status: all Imms up to date    Disposition: Does not desire Dc home today.     Randa Ngo, CNM 01/12/2018  8:24 PM

## 2018-01-12 NOTE — Transfer of Care (Signed)
Immediate Anesthesia Transfer of Care Note  Patient: Meagan Morris  Procedure(s) Performed: POST PARTUM TUBAL LIGATION (Bilateral )  Patient Location: PACU  Anesthesia Type:General  Level of Consciousness: awake and alert   Airway & Oxygen Therapy: Patient Spontanous Breathing and Patient connected to face mask oxygen  Post-op Assessment: Report given to RN  Post vital signs: Reviewed and stable  Last Vitals:  Vitals Value Taken Time  BP    Temp    Pulse 70 01/12/2018  3:00 PM  Resp 19 01/12/2018  3:00 PM  SpO2 100 % 01/12/2018  3:00 PM  Vitals shown include unvalidated device data.  Last Pain:  Vitals:   01/12/18 1245  TempSrc:   PainSc: 0-No pain         Complications: No apparent anesthesia complications

## 2018-01-12 NOTE — Anesthesia Post-op Follow-up Note (Signed)
Anesthesia QCDR form completed.        

## 2018-01-12 NOTE — Anesthesia Procedure Notes (Signed)
Procedure Name: Intubation Performed by: Lesle Reek, CRNA Pre-anesthesia Checklist: Timeout performed, Patient being monitored, Suction available, Emergency Drugs available and Patient identified Patient Re-evaluated:Patient Re-evaluated prior to induction Preoxygenation: Pre-oxygenation with 100% oxygen Induction Type: IV induction Laryngoscope Size: Mac and 4 Grade View: Grade I Tube size: 7.0 mm Number of attempts: 1 Airway Equipment and Method: Stylet Placement Confirmation: ETT inserted through vocal cords under direct vision,  positive ETCO2,  CO2 detector and breath sounds checked- equal and bilateral Secured at: 21 cm Tube secured with: Tape

## 2018-01-12 NOTE — Anesthesia Postprocedure Evaluation (Signed)
Anesthesia Post Note  Patient: Meagan Morris  Procedure(s) Performed: POST PARTUM TUBAL LIGATION (Bilateral )  Patient location during evaluation: PACU Anesthesia Type: General Level of consciousness: awake and alert and oriented Pain management: pain level controlled Vital Signs Assessment: post-procedure vital signs reviewed and stable Respiratory status: spontaneous breathing Cardiovascular status: blood pressure returned to baseline Anesthetic complications: no     Last Vitals:  Vitals:   01/12/18 1716 01/12/18 2107  BP: 114/70 (!) 104/58  Pulse: (!) 53 (!) 57  Resp: 16 20  Temp: (!) 36.4 C 36.9 C  SpO2: 96% 96%    Last Pain:  Vitals:   01/12/18 2110  TempSrc:   PainSc: 5                  Dierdre Mccalip

## 2018-01-12 NOTE — Progress Notes (Signed)
Patient to OR for BTL, infant in room with FOB

## 2018-01-12 NOTE — Progress Notes (Signed)
Patient back from BTL

## 2018-01-12 NOTE — Lactation Note (Signed)
This note was copied from a baby's chart. Lactation Consultation Note  Patient Name: Boy Beanca Kiester Today's Date: 01/12/2018     Maternal Data    Feeding    LATCH Score                   Interventions    Lactation Tools Discussed/Used     Consult Status  Mother stated to [facility] yesterday that she wanted infant to breast and bottle feed and was asking if infant could have a bottle. LC talked to mother about waiting until  breastfeeding is established first before introducing the bottle. LC talked to mother today and mother states that she has made a decision to bottle feed infant and states that she couldn't get infant to breastfeed last night. LC offered to assist with getting infant back to breast but mother did not seem interested.     Arlyss Gandy 01/12/2018, 11:09 AM

## 2018-01-13 ENCOUNTER — Encounter: Payer: Self-pay | Admitting: Obstetrics and Gynecology

## 2018-01-13 LAB — CBC WITH DIFFERENTIAL/PLATELET
ABS IMMATURE GRANULOCYTES: 0.05 10*3/uL (ref 0.00–0.07)
BASOS PCT: 0 %
Basophils Absolute: 0 10*3/uL (ref 0.0–0.1)
Eosinophils Absolute: 0 10*3/uL (ref 0.0–0.5)
Eosinophils Relative: 0 %
HEMATOCRIT: 31.6 % — AB (ref 36.0–46.0)
HEMOGLOBIN: 10 g/dL — AB (ref 12.0–15.0)
Immature Granulocytes: 1 %
LYMPHS ABS: 1.5 10*3/uL (ref 0.7–4.0)
LYMPHS PCT: 15 %
MCH: 26.6 pg (ref 26.0–34.0)
MCHC: 31.6 g/dL (ref 30.0–36.0)
MCV: 84 fL (ref 80.0–100.0)
MONO ABS: 0.3 10*3/uL (ref 0.1–1.0)
MONOS PCT: 3 %
NEUTROS ABS: 8.5 10*3/uL — AB (ref 1.7–7.7)
Neutrophils Relative %: 81 %
Platelets: 259 10*3/uL (ref 150–400)
RBC: 3.76 MIL/uL — ABNORMAL LOW (ref 3.87–5.11)
RDW: 14.9 % (ref 11.5–15.5)
WBC: 10.4 10*3/uL (ref 4.0–10.5)
nRBC: 0 % (ref 0.0–0.2)

## 2018-01-13 LAB — RPR: RPR: NONREACTIVE

## 2018-01-13 MED ORDER — IBUPROFEN 600 MG PO TABS: 600.0000 mg | ORAL_TABLET | Freq: Four times a day (QID) | ORAL | 0 refills | Status: DC

## 2018-01-13 MED ORDER — IBUPROFEN 600 MG PO TABS
600.0000 mg | ORAL_TABLET | Freq: Four times a day (QID) | ORAL | Status: DC
  Administered 2018-01-13 (×2): 600 mg via ORAL
  Filled 2018-01-13 (×2): qty 1

## 2018-01-13 NOTE — Progress Notes (Signed)
Patient discharged home with infant. Discharge instructions and prescriptions given and reviewed with patient. Patient verbalized understanding. Will be escorted out by auxillary.  

## 2018-01-13 NOTE — Discharge Instructions (Signed)

## 2018-01-14 LAB — SURGICAL PATHOLOGY

## 2018-01-15 NOTE — Addendum Note (Signed)
Addendum  created 01/15/18 1134 by Stormy Fabian, CRNA   Charge Capture section accepted

## 2018-05-05 ENCOUNTER — Encounter (INDEPENDENT_AMBULATORY_CARE_PROVIDER_SITE_OTHER): Payer: Self-pay

## 2018-05-05 ENCOUNTER — Other Ambulatory Visit (HOSPITAL_COMMUNITY): Admission: RE | Admit: 2018-05-05 | Payer: Self-pay | Source: Ambulatory Visit | Admitting: Obstetrics and Gynecology

## 2018-05-05 ENCOUNTER — Other Ambulatory Visit (HOSPITAL_COMMUNITY)
Admission: RE | Admit: 2018-05-05 | Discharge: 2018-05-05 | Disposition: A | Discharge status: Home / Self Care | Payer: Self-pay | Source: Ambulatory Visit | Attending: Obstetrics and Gynecology | Admitting: Obstetrics and Gynecology

## 2018-05-05 ENCOUNTER — Ambulatory Visit: Payer: Self-pay | Attending: Oncology | Admitting: *Deleted

## 2018-05-05 ENCOUNTER — Encounter: Payer: Self-pay | Admitting: Obstetrics and Gynecology

## 2018-05-05 ENCOUNTER — Ambulatory Visit (INDEPENDENT_AMBULATORY_CARE_PROVIDER_SITE_OTHER): Payer: Self-pay | Admitting: Obstetrics and Gynecology

## 2018-05-05 ENCOUNTER — Encounter: Payer: Self-pay | Admitting: *Deleted

## 2018-05-05 VITALS — BP 120/70 | HR 56 | Ht 64.0 in | Wt 182.0 lb

## 2018-05-05 VITALS — BP 142/73 | HR 65 | Temp 98.1°F | Ht 64.5 in | Wt 180.4 lb

## 2018-05-05 DIAGNOSIS — R87810 Cervical high risk human papillomavirus (HPV) DNA test positive: Secondary | ICD-10-CM | POA: Insufficient documentation

## 2018-05-05 DIAGNOSIS — R87612 Low grade squamous intraepithelial lesion on cytologic smear of cervix (LGSIL): Secondary | ICD-10-CM | POA: Insufficient documentation

## 2018-05-05 NOTE — Progress Notes (Signed)
   GYNECOLOGY CLINIC COLPOSCOPY PROCEDURE NOTE  38 y.o. 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 Discussed underlying role for HPV infection in the development of cervical dysplasia, its natural history and progression/regression, need for surveillance.  Is the patient  pregnant: No LMP: No LMP recorded. Smoking status:  reports that she has never smoked. She has never used smokeless tobacco. Contraception: tubal ligation   Patient given informed consent, signed copy in the chart, time out was performed.  The patient was position in dorsal lithotomy position. Speculum was placed the cervix was visualized.   After application of acetic acid colposcopic inspection of the cervix was undertaken.   Colposcopy adequate, full visualization of transformation zone: Yes acetowhite lesion(s) noted at 11 and 6  o'clock; corresponding biopsies obtained.   ECC specimen obtained:  Yes  All specimens were labeled and sent to pathology.   Patient was given post procedure instructions.  Will follow up pathology and manage accordingly.  Routine preventative health maintenance measures emphasized.  OBGyn Exam      Vena Austria, MD, [facility], [facility]

## 2018-05-05 NOTE — Progress Notes (Signed)
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  Patient has 4 children.  Most recent dilivery was in November 2019.  Patient is scheduled to see Dr. Bonney Aid for colposcopy and possible biopsy today.  Reviewed abnormal pap and need for colposcopy.  States she has had one before.  States she has permament [place] residency card, which will be important if we need to get her Medicaid.  Patient has been screened for eligibility.  She does not have any insurance, Medicare or Medicaid.  She also meets financial eligibility.  Hand-out given on the Affordable Care Act.  Will follow up per [facility] protocol and Dr. Ramiro Harvest recommendations.

## 2018-05-05 NOTE — Addendum Note (Signed)
Addended by: Lorrene Reid on: 05/05/2018 02:44 PM   Modules accepted: Level of Service

## 2018-05-07 LAB — CYTOLOGY - PAP
Diagnosis: NEGATIVE
HPV: NOT DETECTED

## 2018-05-08 ENCOUNTER — Encounter: Payer: Self-pay | Admitting: Obstetrics and Gynecology

## 2018-05-28 ENCOUNTER — Encounter: Payer: Self-pay | Admitting: *Deleted

## 2018-05-28 NOTE — Progress Notes (Signed)
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  1 year follow up pap per Dr. Bonney Aid.  Letter mailed to inform patient of next pap on 05/06/19 @ 11:00.  Interpreter scheduled.  [facility] to [place].

## 2019-05-06 ENCOUNTER — Ambulatory Visit: Payer: Self-pay | Attending: Oncology

## 2019-06-24 ENCOUNTER — Telehealth: Payer: Self-pay

## 2019-06-24 NOTE — Telephone Encounter (Signed)
TC with patient.  Explained its time for f/u pap.  Patient [facility] with [facility] in February 2021 for pap f/u.  Appt scheduled.Richmond Campbell, RN   Pap 06/03/17 LGSIL; HPV+ Colpo with [facility] 05/05/2018

## 2019-06-29 ENCOUNTER — Ambulatory Visit: Payer: Self-pay

## 2021-05-08 ENCOUNTER — Other Ambulatory Visit: Payer: Self-pay | Admitting: Student

## 2021-05-08 DIAGNOSIS — Z1231 Encounter for screening mammogram for malignant neoplasm of breast: Secondary | ICD-10-CM

## 2021-06-19 ENCOUNTER — Ambulatory Visit
Admission: RE | Admit: 2021-06-19 | Discharge: 2021-06-19 | Disposition: A | Discharge status: Home / Self Care | Payer: Self-pay | Source: Ambulatory Visit | Attending: Student | Admitting: Student

## 2021-06-19 DIAGNOSIS — Z1231 Encounter for screening mammogram for malignant neoplasm of breast: Secondary | ICD-10-CM

## 2021-12-11 DIAGNOSIS — Z23 Encounter for immunization: Secondary | ICD-10-CM | POA: Diagnosis not present

## 2021-12-11 DIAGNOSIS — H9311 Tinnitus, right ear: Secondary | ICD-10-CM | POA: Diagnosis not present

## 2022-04-25 ENCOUNTER — Emergency Department
Admission: EM | Admit: 2022-04-25 | Discharge: 2022-04-25 | Disposition: A | Discharge status: Home / Self Care | Payer: 59 | Attending: Emergency Medicine | Admitting: Emergency Medicine

## 2022-04-25 ENCOUNTER — Encounter: Payer: Self-pay | Admitting: Emergency Medicine

## 2022-04-25 ENCOUNTER — Emergency Department: Payer: 59

## 2022-04-25 DIAGNOSIS — R0789 Other chest pain: Secondary | ICD-10-CM | POA: Diagnosis not present

## 2022-04-25 DIAGNOSIS — A0811 Acute gastroenteropathy due to Norwalk agent: Secondary | ICD-10-CM | POA: Insufficient documentation

## 2022-04-25 DIAGNOSIS — R519 Headache, unspecified: Secondary | ICD-10-CM | POA: Diagnosis not present

## 2022-04-25 DIAGNOSIS — R000 Tachycardia, unspecified: Secondary | ICD-10-CM | POA: Diagnosis not present

## 2022-04-25 DIAGNOSIS — Z1152 Encounter for screening for COVID-19: Secondary | ICD-10-CM | POA: Diagnosis not present

## 2022-04-25 LAB — TROPONIN I (HIGH SENSITIVITY)
Troponin I (High Sensitivity): <2 ng/L (ref <18)
Troponin I (High Sensitivity): 3 ng/L (ref <18)

## 2022-04-25 LAB — URINALYSIS, ROUTINE W REFLEX MICROSCOPIC
Bacteria, UA: NONE SEEN
Bilirubin Urine: NEGATIVE
Glucose, UA: NEGATIVE mg/dL
Hgb urine dipstick: NEGATIVE
Ketones, ur: 5 mg/dL — AB
Leukocytes,Ua: NEGATIVE
Nitrite: NEGATIVE
Protein, ur: 100 mg/dL — AB
Specific Gravity, Urine: 1.032 — ABNORMAL HIGH (ref 1.005–1.030)
pH: 5 (ref 5.0–8.0)

## 2022-04-25 LAB — BASIC METABOLIC PANEL
Anion gap: 10 (ref 5–15)
BUN: 14 mg/dL (ref 6–20)
CO2: 22 mmol/L (ref 22–32)
Calcium: 9.8 mg/dL (ref 8.9–10.3)
Chloride: 106 mmol/L (ref 98–111)
Creatinine, Ser: 0.85 mg/dL (ref 0.44–1.00)
GFR, Estimated: >60 mL/min (ref >60)
Glucose, Bld: 125 mg/dL — ABNORMAL HIGH (ref 70–99)
Potassium: 3.7 mmol/L (ref 3.5–5.1)
Sodium: 138 mmol/L (ref 135–145)

## 2022-04-25 LAB — RESP PANEL BY RT-PCR (RSV, FLU A&B, COVID)  RVPGX2
Influenza A by PCR: NEGATIVE
Influenza B by PCR: NEGATIVE
Resp Syncytial Virus by PCR: NEGATIVE
SARS Coronavirus 2 by RT PCR: NEGATIVE

## 2022-04-25 LAB — LIPASE, BLOOD: Lipase: 30 U/L (ref 11–51)

## 2022-04-25 LAB — CBC
HCT: 47.3 % — ABNORMAL HIGH (ref 36.0–46.0)
Hemoglobin: 15.9 g/dL — ABNORMAL HIGH (ref 12.0–15.0)
MCH: 29 pg (ref 26.0–34.0)
MCHC: 33.6 g/dL (ref 30.0–36.0)
MCV: 86.3 fL (ref 80.0–100.0)
Platelets: 331 10*3/uL (ref 150–400)
RBC: 5.48 MIL/uL — ABNORMAL HIGH (ref 3.87–5.11)
RDW: 12.4 % (ref 11.5–15.5)
WBC: 9.1 10*3/uL (ref 4.0–10.5)
nRBC: 0 % (ref 0.0–0.2)

## 2022-04-25 LAB — HCG, QUANTITATIVE, PREGNANCY: hCG, Beta Chain, Quant, S: 2 m[IU]/mL (ref <5)

## 2022-04-25 MED ORDER — DICYCLOMINE HCL 10 MG PO CAPS: 10.0000 mg | ORAL_CAPSULE | Freq: Three times a day (TID) | ORAL | 0 refills | Status: AC

## 2022-04-25 MED ORDER — ONDANSETRON 4 MG PO TBDP: 4.0000 mg | ORAL_TABLET | Freq: Three times a day (TID) | ORAL | 0 refills | Status: AC | PRN

## 2022-04-25 MED ORDER — LOPERAMIDE HCL 2 MG PO TABS: 2.0000 mg | ORAL_TABLET | Freq: Four times a day (QID) | ORAL | 0 refills | Status: AC | PRN

## 2022-04-25 NOTE — ED Notes (Signed)
Pt unable to give UA at this time 

## 2022-04-25 NOTE — ED Provider Notes (Signed)
[facility] Provider Note  Patient Contact: 2:53 PM (approximate)   History   Chest Pain and Abdominal Pain   HPI  Meagan Morris is a 42 y.o. female who presents emergency department for sudden onset of headaches, body aches, subjective fever, nausea, vomiting, diarrhea.  Symptoms began last night.  Patient has had multiple episodes of diarrhea.  She has been nauseated with vomiting.  States that today she started having a burning in her chest radiating from her abdomen into her chest.  No cardiac history.  No radiation of pain into her back, down her arm, down to her jaw or neck.  Patient has watery diarrhea with no blood visualized.  No dysuria, polyuria, hematuria.  No recent sick contacts.     Physical Exam   Triage Vital Signs: ED Triage Vitals  Enc Vitals Group     BP 04/25/22 1345 (!) 135/95     Pulse Rate 04/25/22 1345 (!) 118     Resp 04/25/22 1345 18     Temp 04/25/22 1345 98.1 F (36.7 C)     Temp Source 04/25/22 1345 Oral     SpO2 04/25/22 1345 95 %     Weight 04/25/22 1343 175 lb (79.4 kg)     Height 04/25/22 1343 5' 5"$  (1.651 m)     Head Circumference --      Peak Flow --      Pain Score 04/25/22 1343 9     Pain Loc --      Pain Edu? --      Excl. in [place]? --     Most recent vital signs: Vitals:   04/25/22 1345  BP: (!) 135/95  Pulse: (!) 118  Resp: 18  Temp: 98.1 F (36.7 C)  SpO2: 95%     General: Alert and in no acute distress. ENT:      Ears:       Nose: No congestion/rhinnorhea.      Mouth/Throat: Mucous membranes are moist. Neck: No stridor. No cervical spine tenderness to palpation  Cardiovascular:  Good peripheral perfusion Respiratory: Normal respiratory effort without tachypnea or retractions. Lungs CTAB. Good air entry to the bases with no decreased or absent breath sounds Gastrointestinal: Slightly hyperactive bowel sounds 4 quadrants.  Soft to palp in all quadrants.  Patient reports mild  diffuse/global tenderness along the abdomen.  No point specific tenderness.. No guarding or rigidity. No palpable masses. No distention. No CVA tenderness. Musculoskeletal: Full range of motion to all extremities.  Neurologic:  No gross focal neurologic deficits are appreciated.  Skin:   No rash noted Other:   ED Results / Procedures / Treatments   Labs (all labs ordered are listed, but only abnormal results are displayed) Labs Reviewed  BASIC METABOLIC PANEL - Abnormal; Notable for the following components:      Result Value   Glucose, Bld 125 (*)    All other components within normal limits  CBC - Abnormal; Notable for the following components:   RBC 5.48 (*)    Hemoglobin 15.9 (*)    HCT 47.3 (*)    All other components within normal limits  URINALYSIS, ROUTINE W REFLEX MICROSCOPIC - Abnormal; Notable for the following components:   Color, Urine AMBER (*)    APPearance CLOUDY (*)    Specific Gravity, Urine 1.032 (*)    Ketones, ur 5 (*)    Protein, ur 100 (*)    All other components within normal limits  RESP  PANEL BY RT-PCR (RSV, FLU A&B, COVID)  RVPGX2  LIPASE, BLOOD  HCG, QUANTITATIVE, PREGNANCY  POC URINE PREG, ED  TROPONIN I (HIGH SENSITIVITY)  TROPONIN I (HIGH SENSITIVITY)     EKG   ED ECG REPORT I, Charline Bills Airabella Barley,  personally viewed and interpreted this ECG.   Date: 04/25/2022  EKG Time: 1346 hrs.  Rate: 114 bpm  Rhythm: there are no previous tracings available for comparison, sinus tachycardia  Axis: Normal axis  Intervals:none  ST&T Change: T wave changes in lateral leads, otherwise no ST elevation or depression noted.  Sinus tachycardia.  No STEMI.   RADIOLOGY  I personally viewed, evaluated, and interpreted these images as part of my medical decision making, as well as reviewing the written report by the radiologist.  ED Provider Interpretation: No acute cardiopulmonary finding on chest x-ray  DG Chest 2 View  Result Date:  04/25/2022 CLINICAL DATA:  Chest pain and shortness of breath with generalized abdominal pain since yesterday. Nausea, vomiting and diarrhea. EXAM: CHEST - 2 VIEW COMPARISON:  Radiographs 03/03/2017. FINDINGS: The heart size and mediastinal contours are stable. The lungs are clear. There is no pleural effusion or pneumothorax. No acute osseous findings are identified. IMPRESSION: Stable chest. No active cardiopulmonary process. Electronically Signed   By: Richardean Sale M.D.   On: 04/25/2022 14:20    PROCEDURES:  Critical Care performed: No  Procedures   MEDICATIONS ORDERED IN ED: Medications - No data to display   IMPRESSION / MDM / [place] / ED COURSE  I reviewed the triage vital signs and the nursing notes.                                 Differential diagnosis includes, but is not limited to, flu, COVID, norovirus, cholecystitis, appendicitis, gastritis  Patient's presentation is most consistent with acute presentation with potential threat to life or bodily function.   Patient's diagnosis is consistent with norovirus.  Patient presents emergency department complaining of diffuse abdominal cramping, significant vomiting and diarrhea that started suddenly last night.  She has headache, body aches as well.  Overall patient had some mild diffused abdominal wall tenderness but no point specific tenderness.  Labs are overall reassuring.  No indication for CT scan at this time.  Patient also had some burning chest pain that began after having the vomiting.  At this time low suspicion for Mallory-Weiss tear or significant esophagitis.  Also low suspicion for ACS/STEMI with reassuring EKG and troponins.  Patient has symptom improvement after Zofran.  Will prescribe Zofran, Bentyl and loperamide for the patient.  Concerning signs and symptoms and return precautions discussed with the patient.. . Patient is given ED precautions to return to the ED for any worsening or new  symptoms.     FINAL CLINICAL IMPRESSION(S) / ED DIAGNOSES   Final diagnoses:  Norovirus     Rx / DC Orders   ED Discharge Orders          Ordered    ondansetron (ZOFRAN-ODT) 4 MG disintegrating tablet  Every 8 hours PRN        04/25/22 1707    dicyclomine (BENTYL) 10 MG capsule  3 times daily before meals & bedtime        04/25/22 1707    loperamide (IMODIUM A-D) 2 MG tablet  4 times daily PRN        04/25/22 1707  Note:  This document was prepared using Dragon voice recognition software and may include unintentional dictation errors.   Darletta Moll, PA-C 04/25/22 1711    Arta Silence, MD 04/26/22 [phone]

## 2022-04-25 NOTE — ED Triage Notes (Signed)
Pt reports CP and generalized abd pain since yesterday. Pt also having n/v/d. Reports recent loss of mother and tearful in triage.

## 2022-04-25 NOTE — ED Notes (Signed)
Pt states headache, nausea, diarrhea, c/p and abd pain since yesterday

## 2022-06-04 DIAGNOSIS — Z9851 Tubal ligation status: Secondary | ICD-10-CM | POA: Diagnosis not present

## 2022-06-04 DIAGNOSIS — R61 Generalized hyperhidrosis: Secondary | ICD-10-CM | POA: Diagnosis not present

## 2022-06-04 DIAGNOSIS — H9313 Tinnitus, bilateral: Secondary | ICD-10-CM | POA: Diagnosis not present

## 2022-06-04 DIAGNOSIS — N926 Irregular menstruation, unspecified: Secondary | ICD-10-CM | POA: Diagnosis not present

## 2022-07-20 DIAGNOSIS — R519 Headache, unspecified: Secondary | ICD-10-CM | POA: Diagnosis not present

## 2022-07-20 DIAGNOSIS — J3489 Other specified disorders of nose and nasal sinuses: Secondary | ICD-10-CM | POA: Diagnosis not present

## 2022-07-20 DIAGNOSIS — H9313 Tinnitus, bilateral: Secondary | ICD-10-CM | POA: Diagnosis not present

## 2022-07-25 ENCOUNTER — Other Ambulatory Visit: Payer: Self-pay

## 2022-07-30 DIAGNOSIS — H9311 Tinnitus, right ear: Secondary | ICD-10-CM | POA: Diagnosis not present

## 2022-07-30 DIAGNOSIS — H93A1 Pulsatile tinnitus, right ear: Secondary | ICD-10-CM | POA: Diagnosis not present

## 2022-07-30 DIAGNOSIS — J302 Other seasonal allergic rhinitis: Secondary | ICD-10-CM | POA: Diagnosis not present

## 2022-08-02 ENCOUNTER — Other Ambulatory Visit: Payer: Self-pay | Admitting: Student

## 2022-08-02 DIAGNOSIS — H93A9 Pulsatile tinnitus, unspecified ear: Secondary | ICD-10-CM

## 2022-08-21 DIAGNOSIS — H1013 Acute atopic conjunctivitis, bilateral: Secondary | ICD-10-CM | POA: Diagnosis not present

## 2022-08-21 DIAGNOSIS — J309 Allergic rhinitis, unspecified: Secondary | ICD-10-CM | POA: Diagnosis not present

## 2022-08-24 ENCOUNTER — Encounter: Payer: Self-pay | Admitting: Student

## 2022-08-27 ENCOUNTER — Ambulatory Visit
Admission: RE | Admit: 2022-08-27 | Discharge: 2022-08-27 | Disposition: A | Discharge status: Home / Self Care | Payer: 59 | Source: Ambulatory Visit | Attending: Student | Admitting: Student

## 2022-08-27 DIAGNOSIS — H93A9 Pulsatile tinnitus, unspecified ear: Secondary | ICD-10-CM

## 2022-08-27 DIAGNOSIS — G935 Compression of brain: Secondary | ICD-10-CM | POA: Diagnosis not present

## 2022-08-27 DIAGNOSIS — H9311 Tinnitus, right ear: Secondary | ICD-10-CM | POA: Diagnosis not present

## 2022-08-27 MED ORDER — GADOPICLENOL 0.5 MMOL/ML IV SOLN
9.0000 mL | Freq: Once | INTRAVENOUS | Status: AC | PRN
  Administered 2022-08-27: 9 mL via INTRAVENOUS

## 2022-10-24 ENCOUNTER — Other Ambulatory Visit (HOSPITAL_COMMUNITY): Payer: Self-pay | Admitting: Neuroradiology

## 2022-10-24 DIAGNOSIS — H93A9 Pulsatile tinnitus, unspecified ear: Secondary | ICD-10-CM

## 2022-10-30 ENCOUNTER — Ambulatory Visit (HOSPITAL_COMMUNITY)
Admission: RE | Admit: 2022-10-30 | Discharge: 2022-10-30 | Disposition: A | Discharge status: Home / Self Care | Payer: 59 | Source: Ambulatory Visit | Attending: Neuroradiology | Admitting: Neuroradiology

## 2022-10-30 DIAGNOSIS — H93A9 Pulsatile tinnitus, unspecified ear: Secondary | ICD-10-CM

## 2022-10-31 NOTE — Consult Note (Signed)
Chief Complaint: Patient was seen in consultation today for pulsatile tinnitus.  Supervising Physician: Baldemar Lenis  Patient Status: [facility] - Out-pt  History of Present Illness: Meagan Morris is a 42 year old female with a past medical history significant for gastroesophageal reflux disease.  She presents today for evaluation of right ear pulsatile tinnitus.  She has been hearing a pulsatile sound in her right ear for approximately a year.  It is worse in quiet environments and improves with manual compression on the right side of her neck, anterior to the mastoid.  She denies headache recently but head significant headache episode about a year ago.  She does not take oral contraceptive) tubal ligation).  She has already been evaluated by Gigi Gin, ENT PA, with no ear pathology found.  She underwent an MRI/MRA of the brain on 08/27/2022 that showed no evidence of inner ear or IAC pathology.  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    Past Medical History:  Diagnosis Date   GERD (gastroesophageal reflux disease)     Past Surgical History:  Procedure Laterality Date   TUBAL LIGATION Bilateral 01/12/2018   Procedure: POST PARTUM TUBAL LIGATION;  Surgeon: Christeen Douglas, MD;  Location: [facility] ORS;  Service: Gynecology;  Laterality: Bilateral;    Allergies: Patient has no known allergies.  Medications: Prior to Admission medications   Medication Sig Start Date End Date Taking? Authorizing Provider  acetaminophen (TYLENOL) 325 MG tablet Take 2 tablets (650 mg total) by mouth every 4 (four) hours as needed for mild pain or moderate pain. 01/07/18   Genia Del, CNM  dicyclomine (BENTYL) 10 MG capsule Take 1 capsule (10 mg total) by mouth 4 (four) times daily -  before meals and at bedtime. 04/25/22   Cuthriell, Delorise Royals, PA-C  loperamide (IMODIUM A-D) 2 MG tablet Take 1 tablet (2 mg total) by mouth 4  (four) times daily as needed. 04/25/22   Cuthriell, Delorise Royals, PA-C  omeprazole (PRILOSEC) 20 MG capsule Take 20 mg by mouth daily.    [provider]  ondansetron (ZOFRAN-ODT) 4 MG disintegrating tablet Take 1 tablet (4 mg total) by mouth every 8 (eight) hours as needed. 04/25/22   Cuthriell, Delorise Royals, PA-C  terbinafine (LAMISIL) 250 MG tablet Take 250 mg by mouth daily.    [provider]     Family History  Problem Relation Age of Onset   Hypertension Mother    Kidney disease Mother    Hypertension Father    Kidney disease Father     Social History   Socioeconomic History   Marital status: Single    Spouse name: Not on file   Number of children: Not on file   Years of education: Not on file   Highest education level: Not on file  Occupational History   Not on file  Tobacco Use   Smoking status: Never   Smokeless tobacco: Never  Substance and Sexual Activity   Alcohol use: Never   Drug use: Never   Sexual activity: Yes    Birth control/protection: Surgical    Comment: BTL   Other Topics Concern   Not on file  Social History Narrative   Not on file   Social Determinants of Health   Financial Resource Strain: Not on file  Food Insecurity: No Food Insecurity (12/04/2017)   Hunger Vital Sign    Worried About Running Out of Food in the Last Year: Never true  Ran Out of Food in the Last Year: Never true  Transportation Needs: No Transportation Needs (12/04/2017)   PRAPARE - Administrator, Civil Service (Medical): No    Lack of Transportation (Non-Medical): No  Physical Activity: Not on file  Stress: Not on file  Social Connections: Not on file     Review of Systems: A 12 point ROS discussed and pertinent positives are indicated in the HPI above.  All other systems are negative.  Review of Systems  Vital Signs: There were no vitals taken for this visit.  Physical Exam Constitutional:      Appearance: Normal appearance.  HENT:      Head: Normocephalic and atraumatic.     Mouth/Throat:     Mouth: Mucous membranes are moist.     Pharynx: Oropharynx is clear.  Eyes:     Extraocular Movements: Extraocular movements intact.     Conjunctiva/sclera: Conjunctivae normal.     Pupils: Pupils are equal, round, and reactive to light.  Pulmonary:     Effort: Pulmonary effort is normal.  Skin:    General: Skin is warm and dry.  Neurological:     Mental Status: She is alert and oriented to person, place, and time.     Cranial Nerves: Cranial nerves 2-12 are intact.     Sensory: Sensation is intact.     Motor: Motor function is intact.     Coordination: Coordination is intact.     Gait: Gait is intact.          Imaging: MRI of the brain  IMPRESSION: 1.  No evidence of an acute intracranial abnormality. 2. No cerebellopontine angle or internal auditory canal mass. 3. 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 5. Otherwise unremarkable MRI appearance of the brain. 6. Mild mucosal thickening within the left maxillary sinus.     Electronically Signed   By: Jackey Loge D.O.   On: 08/27/2022 17:27  Labs:  CBC: Recent Labs    04/25/22 1349  WBC 9.1  HGB 15.9*  HCT 47.3*  PLT 331    COAGS: No results for input(s): "INR", "APTT" in the last 8760 hours.  BMP: Recent Labs    04/25/22 1349  NA 138  K 3.7  CL 106  CO2 22  GLUCOSE 125*  BUN 14  CALCIUM 9.8  CREATININE 0.85  GFRNONAA >60    LIVER FUNCTION TESTS: No results for input(s): "BILITOT", "AST", "ALT", "ALKPHOS", "PROT", "ALBUMIN" in the last 8760 hours.  TUMOR MARKERS: No results for input(s): "AFPTM", "CEA", "CA199", "CHROMGRNA" in the last 8760 hours.  Assessment and Plan:  [name] is a 42 year old female right ear pulsatile tinnitus for approximately a month.  Clinical and imaging characteristics suggest a venous origin of her tinnitus, related to her high riding  right jugular bulb with associated diverticulum.  However, the possibility of a dural arteriovenous fistula cannot be entirely excluded based on the MRI alone.  I have recommended a diagnostic cerebral angiogram to evaluate her right-sided high riding jugular bulb and diverticulum as well as to evaluate for other possible vascular causes of tinnitus.  I explained the risks and benefits of the procedure.  She would like to proceed with the angiogram under moderate sedation.  Our schedulers will reach out to her to book the angiogram.   Thank you for this interesting consult.  I greatly enjoyed meeting Torunn Bayler Mister and look forward to  participating in their care.  A copy of this report was sent to the requesting provider on this date.  Electronically Signed: Baldemar Lenis, MD 10/31/2022, 4:27 PM   I spent a total of  60 Minutes   in face to face in clinical consultation, greater than 50% of which was counseling/coordinating care for pulsatile tinnitus.

## 2022-11-05 ENCOUNTER — Other Ambulatory Visit (HOSPITAL_COMMUNITY): Payer: Self-pay | Admitting: Neuroradiology

## 2022-11-05 DIAGNOSIS — H93A9 Pulsatile tinnitus, unspecified ear: Secondary | ICD-10-CM

## 2022-11-27 DIAGNOSIS — Z13220 Encounter for screening for lipoid disorders: Secondary | ICD-10-CM | POA: Diagnosis not present

## 2022-11-27 DIAGNOSIS — J309 Allergic rhinitis, unspecified: Secondary | ICD-10-CM | POA: Diagnosis not present

## 2022-11-27 DIAGNOSIS — Z0000 Encounter for general adult medical examination without abnormal findings: Secondary | ICD-10-CM | POA: Diagnosis not present

## 2022-11-27 DIAGNOSIS — Z1331 Encounter for screening for depression: Secondary | ICD-10-CM | POA: Diagnosis not present

## 2022-11-27 DIAGNOSIS — K219 Gastro-esophageal reflux disease without esophagitis: Secondary | ICD-10-CM | POA: Diagnosis not present

## 2022-11-27 DIAGNOSIS — Z131 Encounter for screening for diabetes mellitus: Secondary | ICD-10-CM | POA: Diagnosis not present

## 2022-11-27 DIAGNOSIS — H93A9 Pulsatile tinnitus, unspecified ear: Secondary | ICD-10-CM | POA: Diagnosis not present

## 2022-12-03 IMAGING — MG MM DIGITAL SCREENING BILAT W/ TOMO AND CAD
8 series · 9 of 24 positions shown · non-contrast
Comparison: None.

CLINICAL DATA: Screening.

EXAM:
DIGITAL SCREENING BILATERAL MAMMOGRAM WITH TOMOSYNTHESIS AND CAD
TECHNIQUE: Bilateral screening digital craniocaudal and mediolateral oblique
mammograms were obtained. Bilateral screening digital breast
tomosynthesis was performed. The images were evaluated with
computer-aided detection.

[R CC synth-2D]
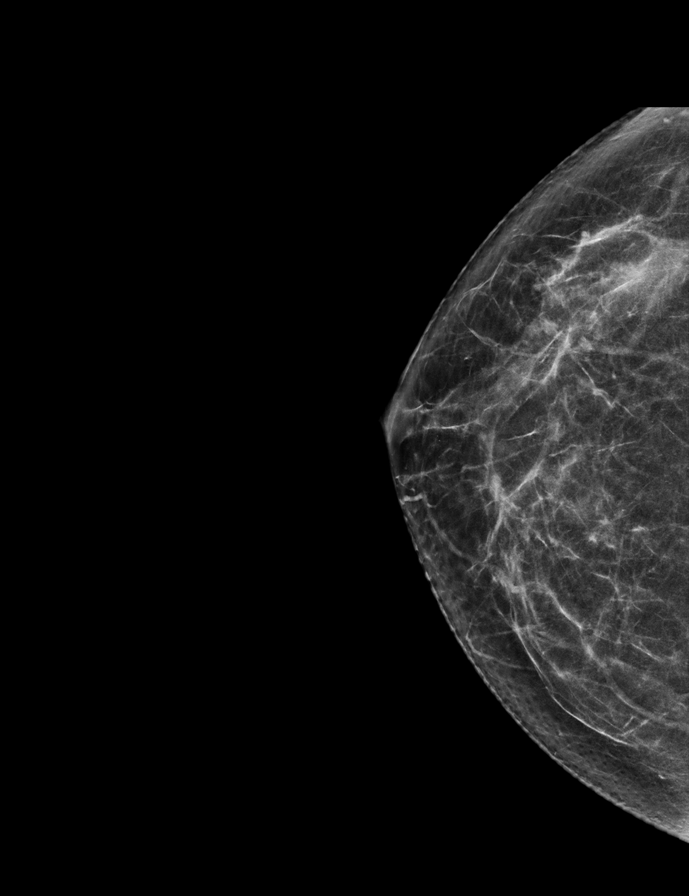

[L CC synth-2D]
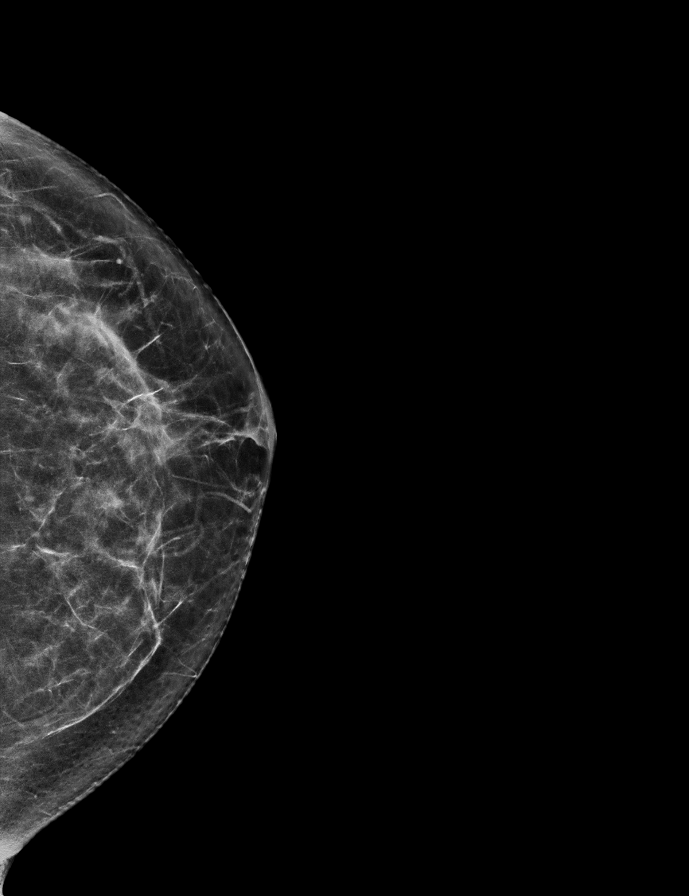

[L MLO synth-2D]
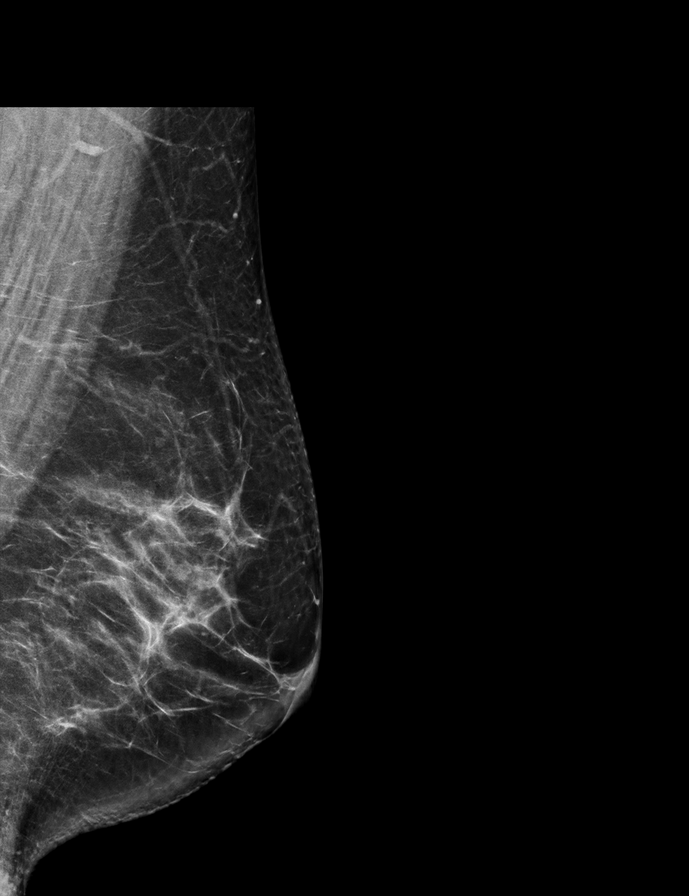

[R MLO synth-2D]
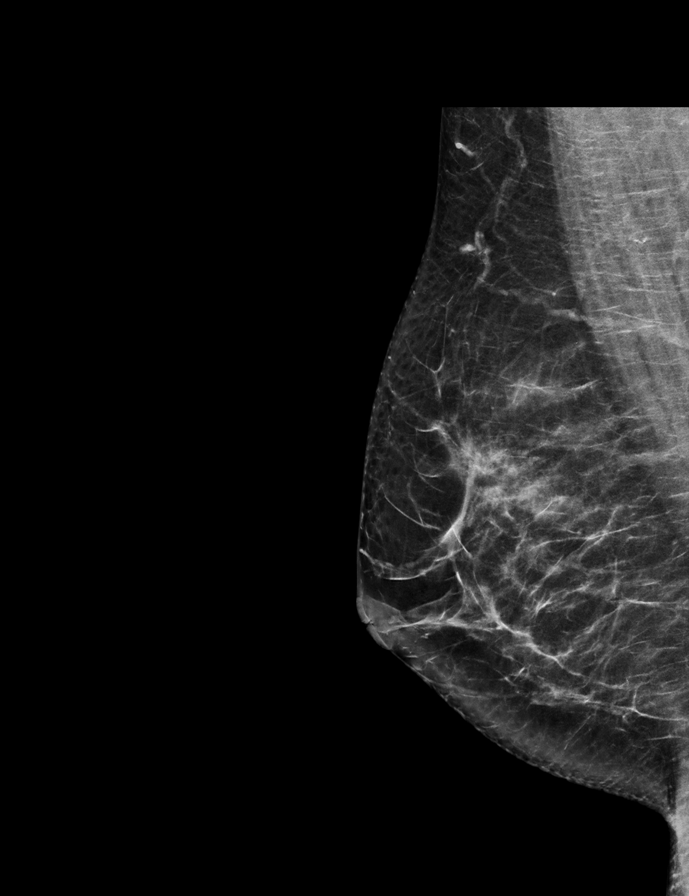

[R MLO tomo · 2 of 70 frames shown]
[frame 23/70]
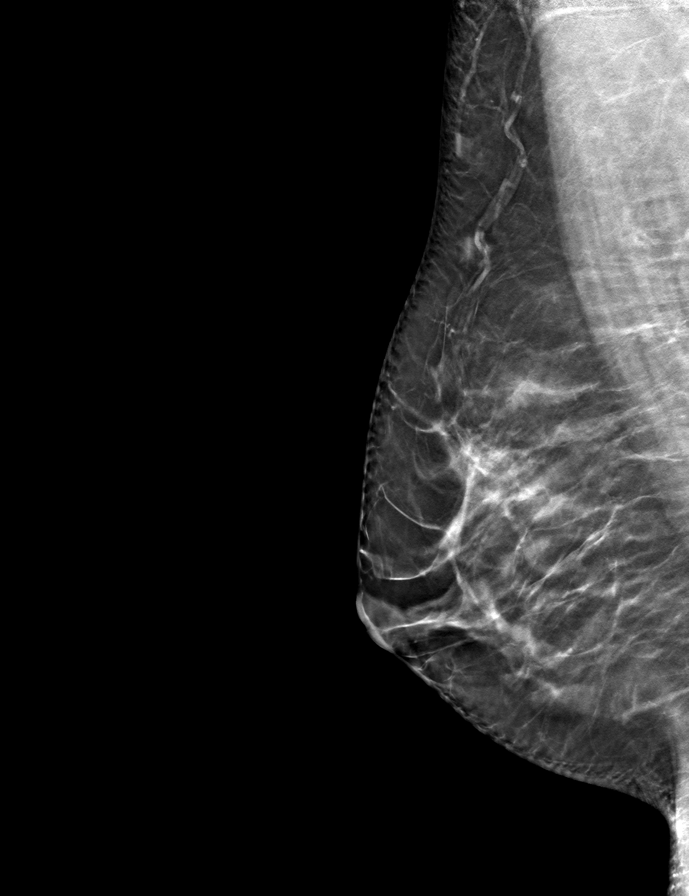
[frame 35/70]
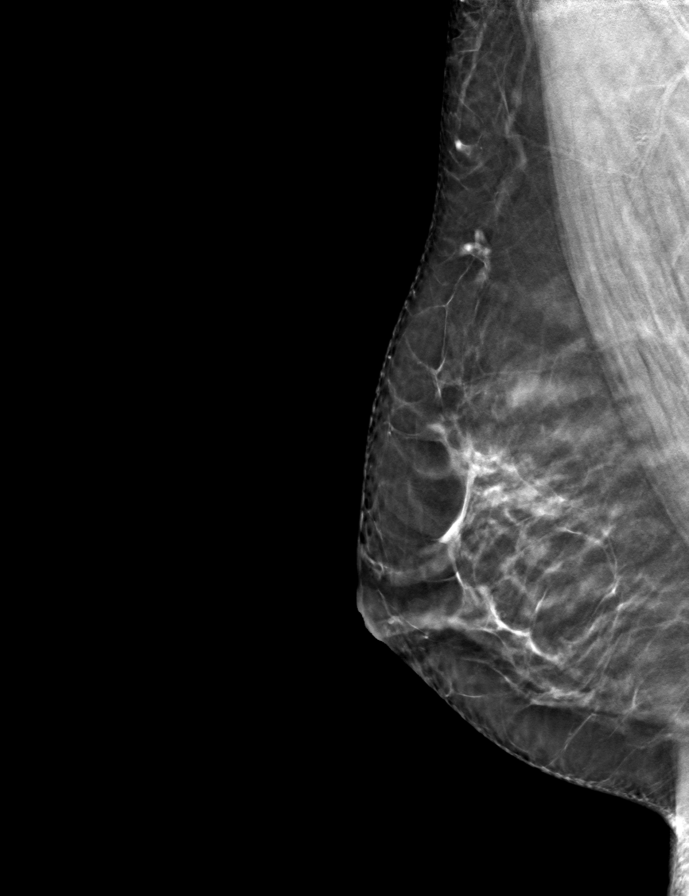

[L MLO tomo · tomo slice 38/75.0]
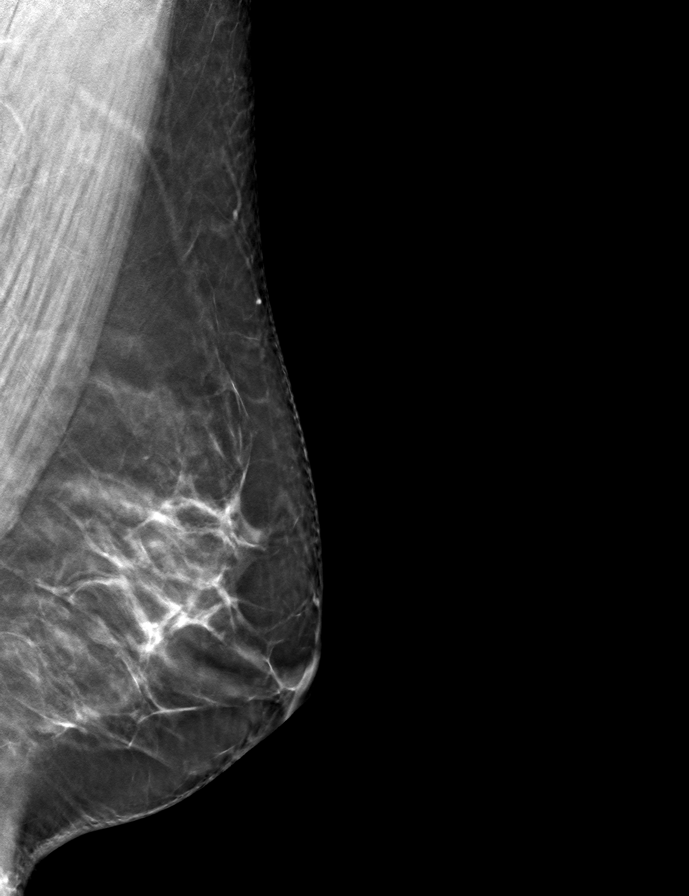

[R CC tomo · tomo slice 33/64.0]
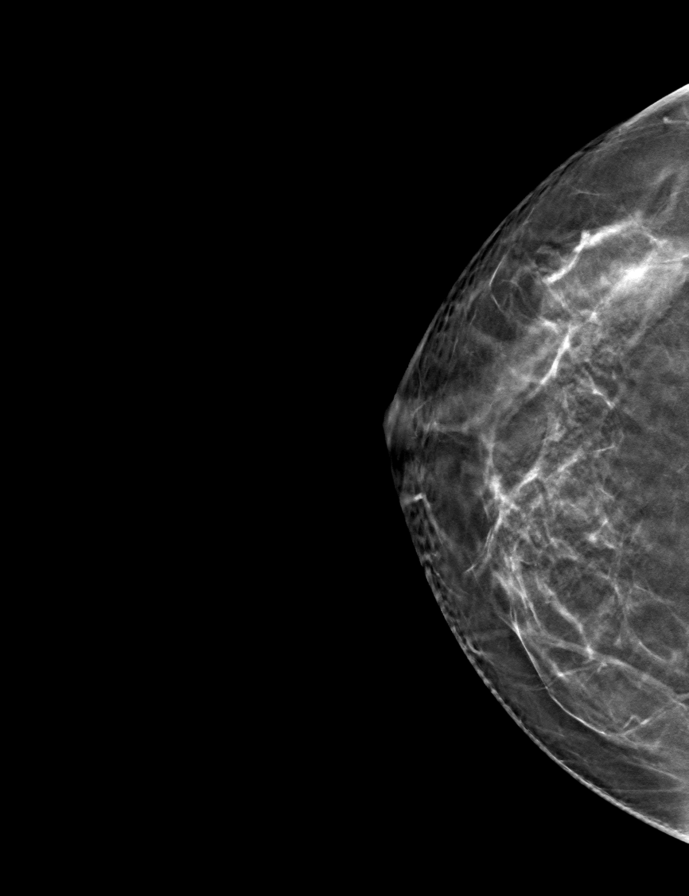

[L CC tomo · tomo slice 33/64.0]
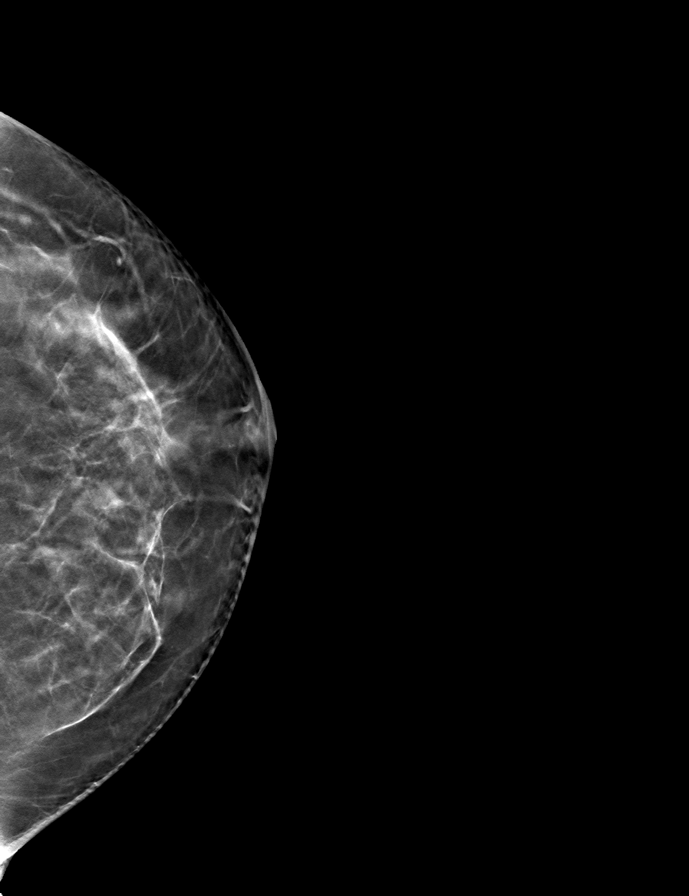

[9 of 24 positions shown; findings below may reference images not displayed]

ACR Breast Density Category b: There are scattered areas of
fibroglandular density.
FINDINGS: There are no findings suspicious for malignancy.
IMPRESSION: No mammographic evidence of malignancy. A result letter of this
screening mammogram will be mailed directly to the patient.

RECOMMENDATION:
Screening mammogram in one year. (Code:XG-X-X7B)

BI-RADS CATEGORY  1: Negative.

## 2022-12-04 ENCOUNTER — Ambulatory Visit (HOSPITAL_COMMUNITY): Payer: 59

## 2022-12-31 ENCOUNTER — Other Ambulatory Visit: Payer: Self-pay | Admitting: Radiology

## 2022-12-31 DIAGNOSIS — H93A9 Pulsatile tinnitus, unspecified ear: Secondary | ICD-10-CM

## 2023-01-01 ENCOUNTER — Ambulatory Visit (HOSPITAL_COMMUNITY): Payer: 59

## 2023-01-22 DIAGNOSIS — M545 Low back pain, unspecified: Secondary | ICD-10-CM | POA: Diagnosis not present

## 2023-02-04 ENCOUNTER — Other Ambulatory Visit: Payer: Self-pay | Admitting: Radiology

## 2023-02-04 ENCOUNTER — Other Ambulatory Visit: Payer: Self-pay | Admitting: Student

## 2023-02-05 ENCOUNTER — Other Ambulatory Visit (HOSPITAL_COMMUNITY): Payer: Self-pay | Admitting: Neuroradiology

## 2023-02-05 ENCOUNTER — Encounter (HOSPITAL_COMMUNITY): Payer: Self-pay

## 2023-02-05 ENCOUNTER — Other Ambulatory Visit: Payer: Self-pay

## 2023-02-05 ENCOUNTER — Ambulatory Visit (HOSPITAL_COMMUNITY)
Admission: RE | Admit: 2023-02-05 | Discharge: 2023-02-05 | Disposition: A | Discharge status: Home / Self Care | Payer: 59 | Source: Ambulatory Visit | Attending: Neuroradiology | Admitting: Neuroradiology

## 2023-02-05 VITALS — BP 118/60 | HR 68 | Temp 97.7°F | Resp 12 | Ht 65.0 in | Wt 180.0 lb

## 2023-02-05 DIAGNOSIS — N926 Irregular menstruation, unspecified: Secondary | ICD-10-CM

## 2023-02-05 DIAGNOSIS — H93A9 Pulsatile tinnitus, unspecified ear: Secondary | ICD-10-CM

## 2023-02-05 DIAGNOSIS — H93A1 Pulsatile tinnitus, right ear: Secondary | ICD-10-CM | POA: Diagnosis present

## 2023-02-05 DIAGNOSIS — I676 Nonpyogenic thrombosis of intracranial venous system: Secondary | ICD-10-CM | POA: Diagnosis not present

## 2023-02-05 HISTORY — PX: IR ANGIO EXTERNAL CAROTID SEL EXT CAROTID BILAT MOD SED: IMG5372

## 2023-02-05 HISTORY — PX: IR US GUIDE VASC ACCESS RIGHT: IMG2390

## 2023-02-05 HISTORY — PX: IR ANGIO VERTEBRAL SEL VERTEBRAL BILAT MOD SED: IMG5369

## 2023-02-05 HISTORY — PX: IR ANGIO INTRA EXTRACRAN SEL INTERNAL CAROTID BILAT MOD SED: IMG5363

## 2023-02-05 LAB — CBC
HCT: 36.7 % (ref 36.0–46.0)
Hemoglobin: 12.4 g/dL (ref 12.0–15.0)
MCH: 29.6 pg (ref 26.0–34.0)
MCHC: 33.8 g/dL (ref 30.0–36.0)
MCV: 87.6 fL (ref 80.0–100.0)
Platelets: 244 10*3/uL (ref 150–400)
RBC: 4.19 MIL/uL (ref 3.87–5.11)
RDW: 12.2 % (ref 11.5–15.5)
WBC: 5.9 10*3/uL (ref 4.0–10.5)
nRBC: 0 % (ref 0.0–0.2)

## 2023-02-05 LAB — BASIC METABOLIC PANEL
Anion gap: 9 (ref 5–15)
BUN: 11 mg/dL (ref 6–20)
CO2: 23 mmol/L (ref 22–32)
Calcium: 9.1 mg/dL (ref 8.9–10.3)
Chloride: 107 mmol/L (ref 98–111)
Creatinine, Ser: 0.77 mg/dL (ref 0.44–1.00)
GFR, Estimated: >60 mL/min (ref >60)
Glucose, Bld: 99 mg/dL (ref 70–99)
Potassium: 3.3 mmol/L — ABNORMAL LOW (ref 3.5–5.1)
Sodium: 139 mmol/L (ref 135–145)

## 2023-02-05 LAB — PROTIME-INR
INR: 1.1 (ref 0.8–1.2)
Prothrombin Time: 14 s (ref 11.4–15.2)

## 2023-02-05 LAB — PREGNANCY, URINE: Preg Test, Ur: NEGATIVE

## 2023-02-05 MED ORDER — LIDOCAINE HCL 1 % IJ SOLN
INTRAMUSCULAR | Status: AC
  Filled 2023-02-05: qty 20

## 2023-02-05 MED ORDER — LIDOCAINE HCL 1 % IJ SOLN
20.0000 mL | Freq: Once | INTRAMUSCULAR | Status: AC
  Administered 2023-02-05: 6 mL

## 2023-02-05 MED ORDER — MIDAZOLAM HCL 2 MG/2ML IJ SOLN
INTRAMUSCULAR | Status: AC | PRN
  Administered 2023-02-05: 1 mg via INTRAVENOUS

## 2023-02-05 MED ORDER — FENTANYL CITRATE (PF) 100 MCG/2ML IJ SOLN
INTRAMUSCULAR | Status: AC
  Filled 2023-02-05: qty 2

## 2023-02-05 MED ORDER — IOHEXOL 300 MG/ML  SOLN
150.0000 mL | Freq: Once | INTRAMUSCULAR | Status: AC | PRN
  Administered 2023-02-05: 55 mL via INTRAVENOUS

## 2023-02-05 MED ORDER — MIDAZOLAM HCL 2 MG/2ML IJ SOLN
INTRAMUSCULAR | Status: AC
  Filled 2023-02-05: qty 2

## 2023-02-05 MED ORDER — FENTANYL CITRATE (PF) 100 MCG/2ML IJ SOLN
INTRAMUSCULAR | Status: AC | PRN
  Administered 2023-02-05: 50 ug via INTRAVENOUS

## 2023-02-05 MED ORDER — SODIUM CHLORIDE 0.9% FLUSH: 10.0000 mL | INTRAVENOUS | Status: DC

## 2023-02-05 MED ORDER — MIDAZOLAM HCL 2 MG/2ML IJ SOLN
INTRAMUSCULAR | Status: AC
Start: 2023-02-05 — End: ?
  Filled 2023-02-05: qty 2

## 2023-02-05 MED ORDER — ACETAMINOPHEN 325 MG PO TABS: 650.0000 mg | ORAL_TABLET | Freq: Four times a day (QID) | ORAL | Status: DC | PRN

## 2023-02-05 NOTE — H&P (Signed)
Chief Complaint: Patient was seen in consultation today for Cerebral arteriogram at the request of Gigi Gin, ENT PA    Patient Status: [facility] - Out-pt  History of Present Illness: Meagan Morris is a 42 y.o. female   FULL Code status per Pt Rt ear pulsatile tinnitus x 1 yr Manual compression works to alleviate it  ENT evaluation is negative MRI/MRA of the brain on 08/27/2022 that showed no evidence of inner ear or IAC pathology.  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    Consultation with Dr Tommi Rumps Melchor Amour 10/30/22 Right ear pulsatile tinnitus for approximately a month. 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 However, the possibility of a dural arteriovenous fistula cannot be entirely excluded based on the MRI alone. I have recommended a diagnostic cerebral angiogram to evaluate her right-sided high riding jugular bulb and diverticulum as well as to evaluate for other possible vascular causes of tinnitus. I explained the risks and benefits of the procedure. She would like to proceed with the angiogram under moderate sedation.   Past Medical History:  Diagnosis Date   GERD (gastroesophageal reflux disease)     Past Surgical History:  Procedure Laterality Date   TUBAL LIGATION Bilateral 01/12/2018   Procedure: POST PARTUM TUBAL LIGATION;  Surgeon: Christeen Douglas, MD;  Location: [facility] ORS;  Service: Gynecology;  Laterality: Bilateral;    Allergies: Patient has no known allergies.  Medications: Prior to Admission medications   Medication Sig Start Date End Date Taking? Authorizing Provider  omeprazole (PRILOSEC) 20 MG capsule Take 20 mg by mouth daily.   Yes [provider]  acetaminophen (TYLENOL) 325 MG tablet Take 2 tablets (650 mg total) by mouth every 4 (four) hours as needed for mild pain  or moderate pain. 01/07/18   Genia Del, CNM  dicyclomine (BENTYL) 10 MG capsule Take 1 capsule (10 mg total) by mouth 4 (four) times daily -  before meals and at bedtime. 04/25/22   Cuthriell, Delorise Royals, PA-C  loperamide (IMODIUM A-D) 2 MG tablet Take 1 tablet (2 mg total) by mouth 4 (four) times daily as needed. 04/25/22   Cuthriell, Delorise Royals, PA-C  ondansetron (ZOFRAN-ODT) 4 MG disintegrating tablet Take 1 tablet (4 mg total) by mouth every 8 (eight) hours as needed. 04/25/22   Cuthriell, Delorise Royals, PA-C  terbinafine (LAMISIL) 250 MG tablet Take 250 mg by mouth daily.    [provider]     Family History  Problem Relation Age of Onset   Hypertension Mother    Kidney disease Mother    Hypertension Father    Kidney disease Father     Social History   Socioeconomic History   Marital status: Single    Spouse name: Not on file   Number of children: Not on file   Years of education: Not on file   Highest education level: Not on file  Occupational History   Not on file  Tobacco Use   Smoking status: Never   Smokeless tobacco: Never  Substance and Sexual Activity   Alcohol use: Never   Drug use: Never   Sexual activity: Yes    Birth control/protection: Surgical    Comment: BTL   Other Topics Concern   Not on file  Social History Narrative   Not on file   Social Determinants of Health   Financial Resource Strain: Patient  Declined (11/27/2022)   Received [facility]   Overall Financial Resource Strain (CARDIA)    Difficulty of Paying Living Expenses: Patient declined  Food Insecurity: Patient Declined (11/27/2022)   Received from [facility]   Hunger Vital Sign    Worried About Running Out of Food in the Last Year: Patient declined    Ran Out of Food in the Last Year: Patient declined  Transportation Needs: Patient Declined (11/27/2022)   Received from [facility] - Transportation    In  the past 12 months, has lack of transportation kept you from medical appointments or from getting medications?: Patient declined    Lack of Transportation (Non-Medical): Patient declined  Physical Activity: Not on file  Stress: Not on file  Social Connections: Not on file    Review of Systems: A 12 point ROS discussed and pertinent positives are indicated in the HPI above.  All other systems are negative.  Review of Systems  Constitutional:  Negative for activity change, fatigue and fever.  HENT:  Positive for tinnitus. Negative for sore throat, trouble swallowing and voice change.   Eyes:  Negative for visual disturbance.  Respiratory:  Negative for cough and shortness of breath.   Cardiovascular:  Negative for chest pain.  Gastrointestinal:  Negative for abdominal pain, nausea and vomiting.  Neurological:  Negative for dizziness, tremors, seizures, syncope, facial asymmetry, speech difficulty, weakness, light-headedness, numbness and headaches.  Psychiatric/Behavioral:  Negative for behavioral problems and confusion.     Vital Signs: BP 108/67   Pulse (!) 51   Temp 97.7 F (36.5 C) (Temporal)   Resp 18   Ht 5\' 5"  (1.651 m)   Wt 180 lb (81.6 kg)   SpO2 98%   BMI 29.95 kg/m     Physical Exam Vitals reviewed.  HENT:     Mouth/Throat:     Mouth: Mucous membranes are moist.  Cardiovascular:     Rate and Rhythm: Normal rate and regular rhythm.     Heart sounds: Normal heart sounds.  Pulmonary:     Effort: Pulmonary effort is normal.     Breath sounds: Normal breath sounds. No wheezing.  Abdominal:     Palpations: Abdomen is soft.     Tenderness: There is no abdominal tenderness.  Musculoskeletal:        General: No swelling or tenderness. Normal range of motion.  Skin:    General: Skin is warm.  Neurological:     Mental Status: She is alert and oriented to person, place, and time.  Psychiatric:        Mood and Affect: Mood normal.        Behavior: Behavior normal.         Thought Content: Thought content normal.        Judgment: Judgment normal.     Imaging: No results found.  Labs:  CBC: Recent Labs    04/25/22 1349 02/05/23 0659  WBC 9.1 5.9  HGB 15.9* 12.4  HCT 47.3* 36.7  PLT 331 244    COAGS: Recent Labs    02/05/23 0659  INR 1.1    BMP: Recent Labs    04/25/22 1349 02/05/23 0659  NA 138 139  K 3.7 3.3*  CL 106 107  CO2 22 23  GLUCOSE 125* 99  BUN 14 11  CALCIUM 9.8 9.1  CREATININE 0.85 0.77  GFRNONAA >60 >60    LIVER FUNCTION TESTS: No results for input(s): "  BILITOT", "AST", "ALT", "ALKPHOS", "PROT", "ALBUMIN" in the last 8760 hours.  TUMOR MARKERS: No results for input(s): "AFPTM", "CEA", "CA199", "CHROMGRNA" in the last 8760 hours.  Assessment and Plan:  Scheduled for Cerebral arteriogram in NIR Risks and benefits of cerebral angiogram with intervention were discussed with the patient including, but not limited to bleeding, infection, vascular injury, contrast induced renal failure, stroke or even death.  This interventional procedure involves the use of X-rays and because of the nature of the planned procedure, it is possible that we will have prolonged use of X-ray fluoroscopy.  Potential radiation risks to you include (but are not limited to) the following: - A slightly elevated risk for cancer  several years later in life. This risk is typically less than 0.5% percent. This risk is low in comparison to the normal incidence of human cancer, which is 33% for women and 50% for men according to the [employer]. - Radiation induced injury can include skin redness, resembling a rash, tissue breakdown / ulcers and hair loss (which can be temporary or permanent).   The likelihood of either of these occurring depends on the difficulty of the procedure and whether you are sensitive to radiation due to previous procedures, disease, or genetic conditions.   IF your procedure requires a prolonged use  of radiation, you will be notified and given written instructions for further action.  It is your responsibility to monitor the irradiated area for the 2 weeks following the procedure and to notify your physician if you are concerned that you have suffered a radiation induced injury.    All of the patient's questions were answered, patient is agreeable to proceed.  Consent signed and in chart.  Thank you for this interesting consult.  I greatly enjoyed meeting Meagan Morris and look forward to participating in their care.  A copy of this report was sent to the requesting provider on this date.  Electronically Signed: Robet Leu, PA-C 02/05/2023, 8:16 AM   I spent a total of  30 Minutes   in face to face in clinical consultation, greater than 50% of which was counseling/coordinating care for Cerebral arteriogram

## 2023-02-05 NOTE — Progress Notes (Signed)
Pt ambulated to and from bathroom to void with no signs of oozing from right groin

## 2023-02-05 NOTE — Procedures (Signed)
INTERVENTIONAL NEURORADIOLOGY BRIEF POSTPROCEDURE NOTE  DIAGNOSTIC CEREBRAL ANGIOGRAM  Attending physician: Baldemar Lenis, MD  Diagnosis: Tinnitus of right ear  Access site: Right common femoral artery.  Access closure: Manual pressure.  Anesthesia: IR sedation: Moderate sedation.  Medication used: 1 mg Versed IV; 50 mcg Fentanyl IV.  Complications: None.  Estimated blood loss: Negligible.  Specimen: None.  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  No evidence of a dural fistula. Complete report to follow.  The patient tolerated the procedure well without incident or complication and is in stable condition.   PLAN: Office visit to discuss angiogram findings.

## 2023-02-11 ENCOUNTER — Telehealth (HOSPITAL_COMMUNITY): Payer: Self-pay

## 2023-02-11 ENCOUNTER — Other Ambulatory Visit (HOSPITAL_COMMUNITY): Payer: Self-pay | Admitting: Neuroradiology

## 2023-02-11 DIAGNOSIS — H93A9 Pulsatile tinnitus, unspecified ear: Secondary | ICD-10-CM

## 2023-02-11 NOTE — Telephone Encounter (Signed)
Called to schedule consult, no answer, left vm. AB

## 2023-02-19 ENCOUNTER — Ambulatory Visit (HOSPITAL_COMMUNITY)
Admission: RE | Admit: 2023-02-19 | Discharge: 2023-02-19 | Disposition: A | Discharge status: Home / Self Care | Payer: 59 | Source: Ambulatory Visit | Attending: Neuroradiology | Admitting: Neuroradiology

## 2023-02-19 DIAGNOSIS — H93A9 Pulsatile tinnitus, unspecified ear: Secondary | ICD-10-CM

## 2023-02-19 NOTE — Progress Notes (Signed)
Referring Physician(s): de Macedo Rodrigues,Willow Reczek  Chief Complaint: The patient is seen in follow up today s/p diagnostic cerebral angiogram.  History of present illness:  Meagan Morris is a 42 year old female with a past medical history significant for gastroesophageal reflux disease.  I first saw her on 10/30/2022 for evaluation of right ear pulsatile tinnitus which she has been experiencing for over a year.  It is worse in quiet environments and improves with manual compression on the right side of her neck, anterior to the mastoid.  She denies headache recently but had significant headache episode about a year ago.  She does not take oral contraceptive (tubal ligation).  She was evaluated by ENT with no ear pathology found.  She underwent an MRI/MRA of the brain on 08/27/2022 that showed no evidence of inner ear or IAC pathology.  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  She underwent a diagnostic cerebral angiogram on 02/05/2023 and returns today to discuss angiogram findings.  Past Medical History:  Diagnosis Date   GERD (gastroesophageal reflux disease)     Past Surgical History:  Procedure Laterality Date   IR ANGIO EXTERNAL CAROTID SEL EXT CAROTID BILAT MOD SED  02/05/2023   IR ANGIO INTRA EXTRACRAN SEL INTERNAL CAROTID BILAT MOD SED  02/05/2023   IR ANGIO VERTEBRAL SEL VERTEBRAL BILAT MOD SED  02/05/2023   IR US GUIDE VASC ACCESS RIGHT  02/05/2023   TUBAL LIGATION Bilateral 01/12/2018   Procedure: POST PARTUM TUBAL LIGATION;  Surgeon: Christeen Douglas, MD;  Location: [facility] ORS;  Service: Gynecology;  Laterality: Bilateral;    Allergies: Patient has no known allergies.  Medications: Prior to Admission medications   Medication Sig Start Date End Date Taking? Authorizing Provider  acetaminophen (TYLENOL) 325 MG tablet Take 2 tablets (650 mg total) by mouth every 4 (four) hours as needed for mild pain or moderate pain.  01/07/18   Genia Del, CNM  dicyclomine (BENTYL) 10 MG capsule Take 1 capsule (10 mg total) by mouth 4 (four) times daily -  before meals and at bedtime. 04/25/22   Cuthriell, Delorise Royals, PA-C  loperamide (IMODIUM A-D) 2 MG tablet Take 1 tablet (2 mg total) by mouth 4 (four) times daily as needed. 04/25/22   Cuthriell, Delorise Royals, PA-C  omeprazole (PRILOSEC) 20 MG capsule Take 20 mg by mouth daily.    [provider]  ondansetron (ZOFRAN-ODT) 4 MG disintegrating tablet Take 1 tablet (4 mg total) by mouth every 8 (eight) hours as needed. 04/25/22   Cuthriell, Delorise Royals, PA-C  terbinafine (LAMISIL) 250 MG tablet Take 250 mg by mouth daily.    [provider]     Family History  Problem Relation Age of Onset   Hypertension Mother    Kidney disease Mother    Hypertension Father    Kidney disease Father     Social History   Socioeconomic History   Marital status: Single    Spouse name: Not on file   Number of children: Not on file   Years of education: Not on file   Highest education level: Not on file  Occupational History   Not on file  Tobacco Use   Smoking status: Never   Smokeless tobacco: Never  Substance and Sexual Activity   Alcohol use: Never   Drug use: Never   Sexual activity: Yes    Birth control/protection: Surgical    Comment: BTL   Other Topics Concern   Not on file  Social History Narrative   Not on file   Social Determinants of Health   Financial Resource Strain: Patient Declined (11/27/2022)   Received from [facility]   Overall Financial Resource Strain (CARDIA)    Difficulty of Paying Living Expenses: Patient declined  Food Insecurity: Patient Declined (11/27/2022)   Received from [facility]   Hunger Vital Sign    Worried About Running Out of Food in the Last Year: Patient declined    Ran Out of Food in the Last Year: Patient declined  Transportation Needs: Patient Declined (11/27/2022)    Received from [facility] - Transportation    In the past 12 months, has lack of transportation kept you from medical appointments or from getting medications?: Patient declined    Lack of Transportation (Non-Medical): Patient declined  Physical Activity: Not on file  Stress: Not on file  Social Connections: Not on file     Vital Signs: There were no vitals taken for this visit.  Physical Exam Constitutional:      Appearance: Normal appearance.  Neurological:     General: No focal deficit present.     Mental Status: She is alert and oriented to person, place, and time. Mental status is at baseline.     Imaging:  EXAM: ULTRASOUND-GUIDED VASCULAR ACCESS   DIAGNOSTIC CEREBRAL ANGIOGRAM   COMPARISON:  MR angiogram August 27, 2022.   MEDICATIONS: No antibiotics utilized.   ANESTHESIA/SEDATION: Moderate (conscious) sedation was employed during this procedure. A total of Versed 1 mg and Fentanyl 50 mcg was administered intravenously by the radiology nurse.   Total intra-service moderate Sedation Time: 39 minutes. The patient's level of consciousness and vital signs were monitored continuously by radiology nursing throughout the procedure under my direct supervision.   CONTRAST:  55 mL of Omnipaque 300 milligram/mL   FLUOROSCOPY: Radiation Exposure Index (as provided by the fluoroscopic device): 775.63 mGy Kerma   COMPLICATIONS: None immediate.   TECHNIQUE: Informed written consent was obtained from the patient after a thorough discussion of the procedural risks, benefits and alternatives. All questions were addressed. Maximal Sterile Barrier Technique was utilized including caps, mask, sterile gowns, sterile gloves, sterile drape, hand hygiene and skin antiseptic. A timeout was performed prior to the initiation of the procedure.   The right groin was prepped and draped in the usual sterile fashion. Using a micropuncture kit and the  modified Seldinger technique, access was gained to the right common femoral artery and a 5 French sheath was placed. Real-time ultrasound guidance was utilized for vascular access including the acquisition of a permanent ultrasound image documenting patency of the accessed vessel.   Under fluoroscopy, a 5 [place] Berenstein 2 catheter was navigated over a 0.035" Terumo Glidewire into the aortic arch. The catheter was placed into the left subclavian artery. Frontal and lateral angiograms of the neck were obtained. There is an biplane roadmap guidance, the catheter was placed into the left vertebral artery. Frontal and lateral angiograms of the head were obtained.   The catheter was placed into the right common carotid artery. Frontal and lateral angiograms of the neck were obtained. Using biplane roadmap guidance, the catheter was placed into the right internal carotid artery. Frontal and lateral angiograms of the head were obtained. Then, the catheter was placed into the right external carotid artery. Frontal and lateral angiograms of the head were obtained.   The catheter was retracted into the innominate artery. Using roadmap guidance, the catheter  was placed into the right vertebral artery. Frontal and lateral angiograms of the head were obtained. The catheter was then retracted into the right subclavian artery. Frontal and lateral angiograms of neck were obtained.   Next, the catheter was placed into the left common carotid artery. Frontal and lateral angiograms of the neck were obtained. Using biplane roadmap guidance, the catheter was advanced into the left internal carotid artery. Frontal, lateral, magnified right anterior oblique and magnified lateral angiograms of the head were obtained. The catheter was then placed into left external carotid artery. Frontal and lateral angiograms of the head were obtained. The catheter was subsequently withdrawn.   Right common femoral  artery angiogram was obtained in right anterior oblique view. The puncture is at the level of the common femoral artery. The artery has normal caliber. The sheath was removed and manual pressure was held over the access site. Adequate hemostasis was achieved.   FINDINGS: Left subclavian angiograms: The left subclavian artery, cervical left vertebral artery and visualized branches of the thyrocervical trunk are unremarkable.   Left vertebral artery angiograms: The left vertebral artery, basilar artery, and bilateral posterior cerebral arteries are unremarkable. Luminal caliber is smooth and tapering. No aneurysms or abnormally high-flow, early draining veins are seen. No regions of abnormal hypervascularity are noted. 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 There is moderate to severe stenosis of the distal right transverse sinus. Prominent venous drainage through torcular and condylar emissary veins into the posterior cervical venous plexus. 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   Right CCA angiograms: Cervical angiograms show normal course and caliber of the visualized right common carotid and internal carotid arteries. There are no significant stenoses.   Right ICA angiograms: There is brisk vascular contrast filling of the right ACA and MCA vascular trees. Luminal caliber is smooth and tapering. No aneurysms or abnormally high-flow, early draining veins are seen. No regions of abnormal hypervascularity are noted. 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 There is also stenosis of the right transverse sinus with prominent anterior drainage via pterygoid plexus.   Right ECA angiograms: No early venous drainage was noted. The visualized branches of the right external carotid artery are unremarkable.   Right vertebral artery angiograms: The right vertebral artery, basilar artery, and bilateral  posterior cerebral arteries are unremarkable. Luminal caliber is smooth and tapering. No aneurysms or abnormally high-flow, early draining veins are seen. No regions of abnormal hypervascularity are noted. 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 There is moderate to severe stenosis of the distal right transverse sinus. Prominent venous drainage through torcular and condylar emissary veins into the posterior cervical venous plexus. 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   Right subclavian angiograms: The right subclavian artery, cervical right vertebral artery and visualized branches of the thyrocervical trunk are unremarkable.   Left CCA angiograms: Cervical angiograms show normal course and caliber of the visualized left common carotid and internal carotid arteries. There are no significant stenoses.   Left ICA angiograms: There is brisk vascular contrast filling of the left ACA and MCA vascular trees. A 2 mm outpouching from the cavernous segment of the right ICA, consistent with tiny aneurysm (extradural). No abnormally high-flow, early draining veins are seen. No regions of abnormal hypervascularity are noted. 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 There is also stenosis of the right transverse sinus. Prominent drainage anteriorly  via pterygoid plexus and posteriorly via emissary veins.   Left ECA angiograms: No early venous drainage was noted. The visualized branches of the left external carotid artery are unremarkable.   Right common femoral artery angiograms: The access is at the level of the mid right common femoral artery. The femoral artery has normal caliber.   PROCEDURE: No intervention performed.   IMPRESSION: 1. 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 Prominent venous  drainage anteriorly via pterygoid plexus and posteriorly via emissary veins. 2. No evidence of a dural AV fistula, AVM or other significant vascular malformation. 3. 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 4. No significant atherosclerotic disease or stenosis of the major arteries of the head and neck.   PLAN: Patient return for outpatient visit to discuss angiogram findings.     Electronically Signed   By: Baldemar Lenis M.D.   On: 02/05/2023 14:00  Labs:  CBC: Recent Labs    04/25/22 1349 02/05/23 0659  WBC 9.1 5.9  HGB 15.9* 12.4  HCT 47.3* 36.7  PLT 331 244    COAGS: Recent Labs    02/05/23 0659  INR 1.1    BMP: Recent Labs    04/25/22 1349 02/05/23 0659  NA 138 139  K 3.7 3.3*  CL 106 107  CO2 22 23  GLUCOSE 125* 99  BUN 14 11  CALCIUM 9.8 9.1  CREATININE 0.85 0.77  GFRNONAA >60 >60    LIVER FUNCTION TESTS: No results for input(s): "BILITOT", "AST", "ALT", "ALKPHOS", "PROT", "ALBUMIN" in the last 8760 hours.  Assessment:  I reviewed the angiogram findings Mrs. Deshawnda Mechem and the significance of these findings.  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  These are likely the cause of her pulsatile tinnitus.  We discussed the impact of the tinnitus on her quality of life.  She says she can only "forget about it " and fall asleep when she is very tired and it does not let her relax.  I explained what the treatment would entail, including the placement of a stent and need for dual anti-platelet therapy.  She said she is dealing with family issues and would like to think about it and give an answer in about 1 week.  We will reach out to her in a week.    Signed: Baldemar Lenis, MD 02/19/2023, 1:43 PM    I spent a total of  25 Minutes in face to face in clinical  consultation, greater than 50% of which was counseling/coordinating care for pulsatile tinnitus.

## 2023-02-27 ENCOUNTER — Telehealth (HOSPITAL_COMMUNITY): Payer: Self-pay

## 2023-02-27 NOTE — Telephone Encounter (Signed)
Pt called to let us know that she would like to move forward with her procedure w/Dr. Quay Burow. Her new insurance doesn't go into effect until 03/13/23. Will send request to insurance then and call to schedule. Pt agreed with this plan. AB
